# Patient Record
Sex: Female | Born: 1940
Health system: Southern US, Community
[De-identification: ages and names within clinical notes are randomized; demographics above are authoritative.]

## PROBLEM LIST (undated history)

## (undated) DIAGNOSIS — K921 Melena: Secondary | ICD-10-CM

## (undated) DIAGNOSIS — F429 Obsessive-compulsive disorder, unspecified: Secondary | ICD-10-CM

## (undated) DIAGNOSIS — G9332 Myalgic encephalomyelitis/chronic fatigue syndrome: Secondary | ICD-10-CM

## (undated) DIAGNOSIS — C4491 Basal cell carcinoma of skin, unspecified: Secondary | ICD-10-CM

## (undated) DIAGNOSIS — R5382 Chronic fatigue, unspecified: Secondary | ICD-10-CM

## (undated) DIAGNOSIS — M810 Age-related osteoporosis without current pathological fracture: Secondary | ICD-10-CM

## (undated) DIAGNOSIS — E785 Hyperlipidemia, unspecified: Secondary | ICD-10-CM

## (undated) DIAGNOSIS — T7840XA Allergy, unspecified, initial encounter: Secondary | ICD-10-CM

## (undated) DIAGNOSIS — F419 Anxiety disorder, unspecified: Secondary | ICD-10-CM

## (undated) HISTORY — DX: Melena: K92.1

## (undated) HISTORY — DX: Hyperlipidemia, unspecified: E78.5

## (undated) HISTORY — PX: BREAST ENHANCEMENT SURGERY: SHX7

## (undated) HISTORY — DX: Age-related osteoporosis without current pathological fracture: M81.0

## (undated) HISTORY — DX: Obsessive-compulsive disorder, unspecified: F42.9

## (undated) HISTORY — DX: Allergy, unspecified, initial encounter: T78.40XA

## (undated) HISTORY — DX: Anxiety disorder, unspecified: F41.9

## (undated) HISTORY — DX: Basal cell carcinoma of skin, unspecified: C44.91

## (undated) HISTORY — DX: Chronic fatigue, unspecified: R53.82

## (undated) HISTORY — DX: Myalgic encephalomyelitis/chronic fatigue syndrome: G93.32

---

## 1998-05-16 ENCOUNTER — Encounter: Admission: RE | Admit: 1998-05-16 | Discharge: 1998-05-16 | Payer: Self-pay | Admitting: Hematology and Oncology

## 1998-07-31 ENCOUNTER — Ambulatory Visit: Admission: RE | Admit: 1998-07-31 | Discharge: 1998-07-31 | Payer: Self-pay | Admitting: *Deleted

## 1998-10-07 ENCOUNTER — Ambulatory Visit (HOSPITAL_COMMUNITY): Admission: RE | Admit: 1998-10-07 | Discharge: 1998-10-07 | Payer: Self-pay | Admitting: Internal Medicine

## 1998-10-07 ENCOUNTER — Encounter: Admission: RE | Admit: 1998-10-07 | Discharge: 1998-10-07 | Payer: Self-pay | Admitting: Internal Medicine

## 1998-11-08 ENCOUNTER — Encounter: Admission: RE | Admit: 1998-11-08 | Discharge: 1998-11-08 | Payer: Self-pay | Admitting: Internal Medicine

## 1999-07-24 ENCOUNTER — Encounter: Admission: RE | Admit: 1999-07-24 | Discharge: 1999-07-24 | Payer: Self-pay | Admitting: Internal Medicine

## 1999-08-22 ENCOUNTER — Encounter: Admission: RE | Admit: 1999-08-22 | Discharge: 1999-08-22 | Payer: Self-pay | Admitting: Internal Medicine

## 1999-10-27 ENCOUNTER — Encounter: Admission: RE | Admit: 1999-10-27 | Discharge: 1999-10-27 | Payer: Self-pay | Admitting: Internal Medicine

## 1999-12-09 ENCOUNTER — Encounter: Admission: RE | Admit: 1999-12-09 | Discharge: 1999-12-09 | Payer: Self-pay | Admitting: Internal Medicine

## 1999-12-24 ENCOUNTER — Ambulatory Visit (HOSPITAL_COMMUNITY): Admission: RE | Admit: 1999-12-24 | Discharge: 1999-12-24 | Payer: Self-pay | Admitting: *Deleted

## 2000-10-12 ENCOUNTER — Encounter: Admission: RE | Admit: 2000-10-12 | Discharge: 2000-10-12 | Payer: Self-pay | Admitting: Hematology and Oncology

## 2001-01-11 ENCOUNTER — Encounter: Admission: RE | Admit: 2001-01-11 | Discharge: 2001-01-11 | Payer: Self-pay | Admitting: Internal Medicine

## 2001-01-20 ENCOUNTER — Encounter: Admission: RE | Admit: 2001-01-20 | Discharge: 2001-01-20 | Payer: Self-pay | Admitting: Internal Medicine

## 2001-01-24 ENCOUNTER — Encounter: Admission: RE | Admit: 2001-01-24 | Discharge: 2001-01-24 | Payer: Self-pay | Admitting: *Deleted

## 2001-01-24 ENCOUNTER — Encounter: Payer: Self-pay | Admitting: *Deleted

## 2001-02-03 ENCOUNTER — Encounter (INDEPENDENT_AMBULATORY_CARE_PROVIDER_SITE_OTHER): Payer: Self-pay | Admitting: *Deleted

## 2001-02-03 ENCOUNTER — Ambulatory Visit (HOSPITAL_BASED_OUTPATIENT_CLINIC_OR_DEPARTMENT_OTHER): Admission: RE | Admit: 2001-02-03 | Discharge: 2001-02-03 | Payer: Self-pay | Admitting: General Surgery

## 2001-02-24 ENCOUNTER — Encounter: Admission: RE | Admit: 2001-02-24 | Discharge: 2001-02-24 | Payer: Self-pay | Admitting: Internal Medicine

## 2001-08-03 ENCOUNTER — Encounter: Admission: RE | Admit: 2001-08-03 | Discharge: 2001-08-03 | Payer: Self-pay | Admitting: Internal Medicine

## 2001-08-25 ENCOUNTER — Encounter: Admission: RE | Admit: 2001-08-25 | Discharge: 2001-08-25 | Payer: Self-pay | Admitting: Internal Medicine

## 2001-10-27 ENCOUNTER — Encounter: Admission: RE | Admit: 2001-10-27 | Discharge: 2001-10-27 | Payer: Self-pay

## 2002-07-31 ENCOUNTER — Encounter: Admission: RE | Admit: 2002-07-31 | Discharge: 2002-07-31 | Payer: Self-pay | Admitting: Internal Medicine

## 2002-08-03 ENCOUNTER — Encounter: Payer: Self-pay | Admitting: *Deleted

## 2002-08-03 ENCOUNTER — Ambulatory Visit (HOSPITAL_COMMUNITY): Admission: RE | Admit: 2002-08-03 | Discharge: 2002-08-03 | Payer: Self-pay | Admitting: *Deleted

## 2002-08-10 ENCOUNTER — Encounter: Admission: RE | Admit: 2002-08-10 | Discharge: 2002-08-10 | Payer: Self-pay | Admitting: Internal Medicine

## 2002-08-24 ENCOUNTER — Encounter: Admission: RE | Admit: 2002-08-24 | Discharge: 2002-08-24 | Payer: Self-pay | Admitting: Internal Medicine

## 2002-08-31 ENCOUNTER — Encounter: Admission: RE | Admit: 2002-08-31 | Discharge: 2002-08-31 | Payer: Self-pay | Admitting: Internal Medicine

## 2002-09-18 ENCOUNTER — Encounter: Admission: RE | Admit: 2002-09-18 | Discharge: 2002-09-18 | Payer: Self-pay | Admitting: Internal Medicine

## 2002-10-05 ENCOUNTER — Encounter: Admission: RE | Admit: 2002-10-05 | Discharge: 2002-10-05 | Payer: Self-pay | Admitting: Internal Medicine

## 2002-11-16 ENCOUNTER — Encounter: Admission: RE | Admit: 2002-11-16 | Discharge: 2002-11-16 | Payer: Self-pay | Admitting: Internal Medicine

## 2002-12-26 ENCOUNTER — Encounter: Admission: RE | Admit: 2002-12-26 | Discharge: 2002-12-26 | Payer: Self-pay | Admitting: Internal Medicine

## 2003-01-12 ENCOUNTER — Encounter: Admission: RE | Admit: 2003-01-12 | Discharge: 2003-01-12 | Payer: Self-pay | Admitting: Internal Medicine

## 2003-02-01 ENCOUNTER — Encounter: Admission: RE | Admit: 2003-02-01 | Discharge: 2003-02-01 | Payer: Self-pay | Admitting: Internal Medicine

## 2003-10-08 ENCOUNTER — Encounter: Admission: RE | Admit: 2003-10-08 | Discharge: 2003-10-08 | Payer: Self-pay | Admitting: Internal Medicine

## 2004-06-13 ENCOUNTER — Encounter: Admission: RE | Admit: 2004-06-13 | Discharge: 2004-06-13 | Payer: Self-pay | Admitting: Internal Medicine

## 2004-06-13 ENCOUNTER — Encounter (INDEPENDENT_AMBULATORY_CARE_PROVIDER_SITE_OTHER): Payer: Self-pay | Admitting: Specialist

## 2004-06-13 ENCOUNTER — Other Ambulatory Visit: Admission: RE | Admit: 2004-06-13 | Discharge: 2004-06-13 | Payer: Self-pay | Admitting: Internal Medicine

## 2004-08-13 ENCOUNTER — Encounter: Admission: RE | Admit: 2004-08-13 | Discharge: 2004-08-13 | Payer: Self-pay | Admitting: Internal Medicine

## 2004-11-11 ENCOUNTER — Ambulatory Visit: Payer: Self-pay | Admitting: Internal Medicine

## 2005-05-07 ENCOUNTER — Ambulatory Visit: Payer: Self-pay | Admitting: Internal Medicine

## 2005-05-29 ENCOUNTER — Ambulatory Visit: Payer: Self-pay | Admitting: Internal Medicine

## 2005-07-27 ENCOUNTER — Ambulatory Visit: Payer: Self-pay | Admitting: Internal Medicine

## 2005-08-18 ENCOUNTER — Ambulatory Visit (HOSPITAL_COMMUNITY): Admission: RE | Admit: 2005-08-18 | Discharge: 2005-08-18 | Payer: Self-pay | Admitting: Internal Medicine

## 2005-08-18 ENCOUNTER — Encounter: Payer: Self-pay | Admitting: Internal Medicine

## 2005-10-21 ENCOUNTER — Encounter: Payer: Self-pay | Admitting: Internal Medicine

## 2005-10-21 ENCOUNTER — Ambulatory Visit: Payer: Self-pay | Admitting: Internal Medicine

## 2005-11-11 ENCOUNTER — Ambulatory Visit: Payer: Self-pay | Admitting: Internal Medicine

## 2005-11-13 ENCOUNTER — Ambulatory Visit: Payer: Self-pay | Admitting: Internal Medicine

## 2006-02-10 ENCOUNTER — Ambulatory Visit: Payer: Self-pay | Admitting: Internal Medicine

## 2006-03-10 ENCOUNTER — Ambulatory Visit: Payer: Self-pay | Admitting: Internal Medicine

## 2006-07-01 ENCOUNTER — Other Ambulatory Visit: Admission: RE | Admit: 2006-07-01 | Discharge: 2006-07-01 | Payer: Self-pay | Admitting: Internal Medicine

## 2006-07-01 ENCOUNTER — Encounter: Payer: Self-pay | Admitting: Internal Medicine

## 2006-07-01 ENCOUNTER — Ambulatory Visit: Payer: Self-pay | Admitting: Internal Medicine

## 2006-08-20 ENCOUNTER — Ambulatory Visit (HOSPITAL_COMMUNITY): Admission: RE | Admit: 2006-08-20 | Discharge: 2006-08-20 | Payer: Self-pay | Admitting: Internal Medicine

## 2006-08-20 ENCOUNTER — Ambulatory Visit: Payer: Self-pay | Admitting: Internal Medicine

## 2006-10-11 ENCOUNTER — Encounter: Payer: Self-pay | Admitting: Internal Medicine

## 2006-10-18 DIAGNOSIS — M533 Sacrococcygeal disorders, not elsewhere classified: Secondary | ICD-10-CM | POA: Insufficient documentation

## 2006-10-18 DIAGNOSIS — F411 Generalized anxiety disorder: Secondary | ICD-10-CM | POA: Insufficient documentation

## 2006-10-18 DIAGNOSIS — R5382 Chronic fatigue, unspecified: Secondary | ICD-10-CM | POA: Insufficient documentation

## 2006-10-18 DIAGNOSIS — Z978 Presence of other specified devices: Secondary | ICD-10-CM | POA: Insufficient documentation

## 2006-10-18 DIAGNOSIS — J309 Allergic rhinitis, unspecified: Secondary | ICD-10-CM | POA: Insufficient documentation

## 2006-10-18 DIAGNOSIS — K921 Melena: Secondary | ICD-10-CM

## 2006-10-26 ENCOUNTER — Encounter: Payer: Self-pay | Admitting: Internal Medicine

## 2006-11-10 ENCOUNTER — Ambulatory Visit: Payer: Self-pay | Admitting: Hospitalist

## 2007-03-03 ENCOUNTER — Telehealth: Payer: Self-pay | Admitting: *Deleted

## 2007-03-09 ENCOUNTER — Encounter: Payer: Self-pay | Admitting: Internal Medicine

## 2007-04-01 ENCOUNTER — Telehealth: Payer: Self-pay | Admitting: *Deleted

## 2007-04-04 ENCOUNTER — Telehealth: Payer: Self-pay | Admitting: *Deleted

## 2007-04-06 ENCOUNTER — Ambulatory Visit: Payer: Self-pay | Admitting: *Deleted

## 2007-04-06 DIAGNOSIS — G43009 Migraine without aura, not intractable, without status migrainosus: Secondary | ICD-10-CM | POA: Insufficient documentation

## 2007-04-06 DIAGNOSIS — F429 Obsessive-compulsive disorder, unspecified: Secondary | ICD-10-CM

## 2007-07-12 ENCOUNTER — Encounter: Payer: Self-pay | Admitting: Internal Medicine

## 2007-07-12 ENCOUNTER — Ambulatory Visit: Payer: Self-pay | Admitting: Internal Medicine

## 2007-07-12 LAB — CONVERTED CEMR LAB
AST: 18 units/L (ref 0–37)
Basophils Relative: 1 % (ref 0–1)
Calcium: 9.1 mg/dL (ref 8.4–10.5)
Chloride: 110 meq/L (ref 96–112)
Creatinine, Ser: 0.9 mg/dL (ref 0.40–1.20)
Eosinophils Absolute: 0.2 10*3/uL (ref 0.0–0.7)
Eosinophils Relative: 3 % (ref 0–5)
Lymphs Abs: 2 10*3/uL (ref 0.7–3.3)
MCHC: 33.4 g/dL (ref 30.0–36.0)
MCV: 92.6 fL (ref 78.0–100.0)
Monocytes Relative: 7 % (ref 3–11)
Neutro Abs: 2.5 10*3/uL (ref 1.7–7.7)
Potassium: 4.2 meq/L (ref 3.5–5.3)
RBC: 4.3 M/uL (ref 3.87–5.11)
Sodium: 146 meq/L — ABNORMAL HIGH (ref 135–145)
Total Bilirubin: 0.3 mg/dL (ref 0.3–1.2)
WBC: 5 10*3/uL (ref 4.0–10.5)

## 2007-10-06 ENCOUNTER — Ambulatory Visit: Payer: Self-pay | Admitting: *Deleted

## 2007-11-22 ENCOUNTER — Telehealth: Payer: Self-pay | Admitting: *Deleted

## 2008-04-16 ENCOUNTER — Telehealth: Payer: Self-pay | Admitting: Internal Medicine

## 2008-04-23 ENCOUNTER — Telehealth (INDEPENDENT_AMBULATORY_CARE_PROVIDER_SITE_OTHER): Payer: Self-pay | Admitting: Pharmacy Technician

## 2008-04-23 ENCOUNTER — Ambulatory Visit: Payer: Self-pay | Admitting: Infectious Disease

## 2008-04-23 DIAGNOSIS — J069 Acute upper respiratory infection, unspecified: Secondary | ICD-10-CM | POA: Insufficient documentation

## 2008-05-15 ENCOUNTER — Telehealth: Payer: Self-pay | Admitting: Internal Medicine

## 2008-05-16 ENCOUNTER — Telehealth (INDEPENDENT_AMBULATORY_CARE_PROVIDER_SITE_OTHER): Payer: Self-pay | Admitting: Pharmacy Technician

## 2008-08-02 ENCOUNTER — Ambulatory Visit: Payer: Self-pay | Admitting: *Deleted

## 2008-08-02 ENCOUNTER — Ambulatory Visit (HOSPITAL_COMMUNITY): Admission: RE | Admit: 2008-08-02 | Discharge: 2008-08-02 | Payer: Self-pay | Admitting: *Deleted

## 2008-08-02 ENCOUNTER — Encounter: Payer: Self-pay | Admitting: Internal Medicine

## 2008-08-02 DIAGNOSIS — R079 Chest pain, unspecified: Secondary | ICD-10-CM

## 2008-08-16 ENCOUNTER — Ambulatory Visit: Payer: Self-pay | Admitting: *Deleted

## 2008-08-16 ENCOUNTER — Encounter: Payer: Self-pay | Admitting: Internal Medicine

## 2008-08-16 LAB — CONVERTED CEMR LAB
BUN: 21 mg/dL (ref 6–23)
Calcium: 9.6 mg/dL (ref 8.4–10.5)
Chloride: 106 meq/L (ref 96–112)
Creatinine, Ser: 0.71 mg/dL (ref 0.40–1.20)
Glucose, Bld: 90 mg/dL (ref 70–99)
TSH: 1.842 microintl units/mL (ref 0.350–4.50)
Total CHOL/HDL Ratio: 3.1
VLDL: 22 mg/dL (ref 0–40)

## 2008-08-27 ENCOUNTER — Encounter: Admission: RE | Admit: 2008-08-27 | Discharge: 2008-08-27 | Payer: Self-pay | Admitting: Internal Medicine

## 2008-10-09 ENCOUNTER — Ambulatory Visit: Payer: Self-pay | Admitting: Infectious Disease

## 2008-10-09 DIAGNOSIS — M79609 Pain in unspecified limb: Secondary | ICD-10-CM

## 2008-10-11 ENCOUNTER — Encounter: Payer: Self-pay | Admitting: Internal Medicine

## 2008-10-15 ENCOUNTER — Encounter: Payer: Self-pay | Admitting: Internal Medicine

## 2008-10-15 ENCOUNTER — Ambulatory Visit: Payer: Self-pay | Admitting: Internal Medicine

## 2008-10-15 DIAGNOSIS — N952 Postmenopausal atrophic vaginitis: Secondary | ICD-10-CM

## 2008-11-05 ENCOUNTER — Ambulatory Visit (HOSPITAL_COMMUNITY): Admission: RE | Admit: 2008-11-05 | Discharge: 2008-11-05 | Payer: Self-pay | Admitting: Internal Medicine

## 2009-03-14 ENCOUNTER — Encounter (INDEPENDENT_AMBULATORY_CARE_PROVIDER_SITE_OTHER): Payer: Self-pay | Admitting: Internal Medicine

## 2009-03-14 ENCOUNTER — Encounter: Payer: Self-pay | Admitting: Internal Medicine

## 2009-03-14 ENCOUNTER — Ambulatory Visit: Payer: Self-pay | Admitting: *Deleted

## 2009-03-14 DIAGNOSIS — M810 Age-related osteoporosis without current pathological fracture: Secondary | ICD-10-CM | POA: Insufficient documentation

## 2009-03-14 DIAGNOSIS — M549 Dorsalgia, unspecified: Secondary | ICD-10-CM | POA: Insufficient documentation

## 2009-03-15 LAB — CONVERTED CEMR LAB
Cholesterol: 187 mg/dL (ref 0–200)
HDL: 54 mg/dL (ref >39)
VLDL: 23 mg/dL (ref 0–40)

## 2009-09-03 ENCOUNTER — Encounter: Payer: Self-pay | Admitting: Internal Medicine

## 2009-10-02 ENCOUNTER — Ambulatory Visit: Payer: Self-pay | Admitting: Internal Medicine

## 2009-10-04 ENCOUNTER — Telehealth: Payer: Self-pay | Admitting: *Deleted

## 2010-04-28 ENCOUNTER — Ambulatory Visit: Payer: Self-pay | Admitting: Internal Medicine

## 2010-05-05 DIAGNOSIS — E559 Vitamin D deficiency, unspecified: Secondary | ICD-10-CM | POA: Insufficient documentation

## 2010-05-05 LAB — CONVERTED CEMR LAB
AST: 17 units/L (ref 0–37)
BUN: 16 mg/dL (ref 6–23)
Basophils Absolute: 0 10*3/uL (ref 0.0–0.1)
Basophils Relative: 1 % (ref 0–1)
CO2: 24 meq/L (ref 19–32)
Calcium: 9.1 mg/dL (ref 8.4–10.5)
Creatinine, Ser: 0.68 mg/dL (ref 0.40–1.20)
Eosinophils Relative: 3 % (ref 0–5)
Glucose, Bld: 97 mg/dL (ref 70–99)
HDL: 60 mg/dL (ref >39)
Lymphocytes Relative: 39 % (ref 12–46)
Lymphs Abs: 1.8 10*3/uL (ref 0.7–4.0)
MCV: 93.7 fL (ref >=78.0)
Neutrophils Relative %: 51 % (ref 43–77)
Potassium: 3.8 meq/L (ref 3.5–5.3)
RBC: 4.42 M/uL (ref 3.87–5.11)
Total CHOL/HDL Ratio: 3.2
Total Protein: 6.5 g/dL (ref 6.0–8.3)
VLDL: 23 mg/dL (ref 0–40)
WBC: 4.7 10*3/uL (ref 4.0–10.5)

## 2010-05-06 ENCOUNTER — Telehealth: Payer: Self-pay | Admitting: Internal Medicine

## 2010-06-12 ENCOUNTER — Encounter: Admission: RE | Admit: 2010-06-12 | Discharge: 2010-06-12 | Payer: Self-pay | Admitting: Internal Medicine

## 2010-09-15 ENCOUNTER — Telehealth: Payer: Self-pay | Admitting: Internal Medicine

## 2010-09-16 ENCOUNTER — Ambulatory Visit: Payer: Self-pay | Admitting: Internal Medicine

## 2010-09-30 ENCOUNTER — Ambulatory Visit: Payer: Self-pay | Admitting: Internal Medicine

## 2011-01-18 ENCOUNTER — Encounter: Payer: Self-pay | Admitting: Internal Medicine

## 2011-01-29 NOTE — Progress Notes (Signed)
Summary: Vit D  Phone Note Outgoing Call   Call placed by: Angelina Ok RN,  May 06, 2010 10:56 AM Call placed to: Patient Summary of Call: Call to pt to inform her that her Vitamin D level is low per Dr. Phillips Odor.   Pt will need to take 2000 units of Vit D daily.  Pt said that she is alreaddy taking Vit D 800 mg and Calcium 1200 mg daily and wanted to double dosing of this instead.   Dr. Phillips Odor was consulted and pt is to take an additional 2000 mg of Vit D daily and to come in for an appointment in 3 months.  Prescription for has been called to pt's pharmacy.  Message was left on pt's answering machine to continue her current dosing and to take the additional 2000 mg tablets that has been ordered at her pharmacy. Angelina Ok RN  May 06, 2010 11:01 AM  Initial call taken by: Angelina Ok RN,  May 06, 2010 11:02 AM  Follow-up for Phone Call        agree with plan outline Follow-up by: Julaine Fusi  DO,  May 06, 2010 11:55 AM

## 2011-01-29 NOTE — Progress Notes (Signed)
Summary: Feeling weak  Phone Note Call from Patient   Caller: Patient Call For: Julaine Fusi  DO Summary of Call: Call from pt said that she had a bug a couple weeks ago.  Very weak.  Fire department did vitals every thing was within normal visits.  Said that she  in the past took the Mineral Vitamins and that helped.  Pt is unable to do a lot of activity. [facility].  No shortness of breath .  Has had this in the past. Angelina Ok RN  September 15, 2010 9:00 AM  Initial call taken by: Angelina Ok RN,  September 15, 2010 9:00 AM  Follow-up for Phone Call        She probably needs to be seen in clinic for more information and maybe some lab work especially if her weakness has been a couple of weeks- should be getting betetr if it was a virus etc... is she short of breath, and fevers.Marland Kitchenanything else going on? Can be seen by anyone just for a check. Follow-up by: Julaine Fusi  DO,  September 15, 2010 10:38 AM  Additional Follow-up for Phone Call Additional follow up Details #1::        Spoke with pt said that she is having no shortness of breath.  Just feeling realy tired.  Says she is sure she has Anemia.  Pt was given an appointment for tomorrow. Angelina Ok RN  September 15, 2010 5:25 PM

## 2011-01-29 NOTE — Assessment & Plan Note (Signed)
Summary: flu shot/cfb  Nurse Visit   Allergies: 1)  ! * No Blood Products 2)  ! * Environmental 3)  ! * "multi Chemical Sensitivity"  Immunizations Administered:  Influenza Vaccine # 1:    Vaccine Type: Fluvax MCR    Site: right deltoid    Mfr: [employer]    Dose: 0.5 ml    Route: IM    Given by: Chinita Pester RN    Exp. Date: 06/27/2011    Lot #: [id number]    VIS given: 07/22/10 version given September 30, 2010.  Flu Vaccine Consent Questions:    Do you have a history of severe allergic reactions to this vaccine? no    Any prior history of allergic reactions to egg and/or gelatin? no    Do you have a sensitivity to the preservative Thimersol? no    Do you have a past history of Guillan-Barre Syndrome? no    Do you currently have an acute febrile illness? no    Have you ever had a severe reaction to latex? no    Vaccine information given and explained to patient? yes    Are you currently pregnant? no  Orders Added: 1)  Influenza Vaccine MCR [00025]

## 2011-01-29 NOTE — Assessment & Plan Note (Signed)
Summary: ACUTE/GOLDING/FATIGUE/CH   Vital Signs:  Patient profile:   70 year old female Height:      65 inches (165.10 cm) Weight:      128.02 pounds (58.19 kg) BMI:     21.38 Temp:     98.2 degrees F (36.78 degrees C) oral Pulse rate:   75 / minute BP sitting:   120 / 79  (right arm)  Vitals Entered By: Angelina Ok RN (September 16, 2010 1:49 PM) Is Patient Diabetic? No Pain Assessment Patient in pain? no      Nutritional Status BMI of 19 -24 = normal  Have you ever been in a relationship where you felt threatened, hurt or afraid?No   Does patient need assistance? Functional Status Self care Ambulation Normal Comments Feels it is her Chronic Fatigue   Primary Care Provider:  Julaine Fusi  DO   History of Present Illness: 70 y/o woman with chornic fatigue OCDs, anxiety, vit D def comes for a routine follow up visit. says that she has been very weak in the recent past after a sore throat. she feels that this is a part of her chronic fatigue and seems to getting better. she request refills for multivitamins and miralax. also complaints of some discomfort in throat when swallowing,   Depression History:      The patient denies a depressed mood most of the day and a diminished interest in her usual daily activities.         Preventive Screening-Counseling & Management  Alcohol-Tobacco     Smoking Status: never  Current Medications (verified): 1)  Fluvoxamine Maleate 50 Mg Tabs (Fluvoxamine Maleate) .... Take 1 Tablet By Mouth Three Times A Day 2)  Imitrex 100 Mg Tabs (Sumatriptan Succinate) .... Take One Tab As Needed For Migraine Not To Exceed More That Two Tablets in 24 Hour Period. 3)  Oscal 500/200 D-3 500-200 Mg-Unit  Tabs (Calcium-Vitamin D) .Marland Kitchen.. 1 Tablet Twice A Day. 4)  Ferrous Sulfate 325 (65 Fe) Mg Tbec (Ferrous Sulfate) .... Take 1 Tablet By Mouth Once A Day 5)  Miralax  Powd (Polyethylene Glycol 3350) .... Mix It Water or Milk and Take Once Daily At Bedtime  As Needed For Constipations 6)  D 2000 2000 Unit Tabs (Cholecalciferol) .... Take 1 Tablet By Mouth Once A Day 7)  Pantoprazole Sodium 40 Mg Tbec (Pantoprazole Sodium) .... Take 1 Tablet By Mouth Once A Day 8)  Senior Multivitamin Plus  Tabs (Multiple Vitamins-Minerals) .... Take 1 Tablet By Mouth Two Times A Day. Take Folic Acid and Iron Supplements Extra Along With These Tablets. .  Allergies (verified): 1)  ! * No Blood Products 2)  ! * Environmental 3)  ! * "multi Chemical Sensitivity"  Review of Systems  The patient denies anorexia, fever, weight loss, weight gain, vision loss, decreased hearing, hoarseness, chest pain, syncope, dyspnea on exertion, peripheral edema, prolonged cough, headaches, hemoptysis, abdominal pain, melena, hematochezia, severe indigestion/heartburn, hematuria, incontinence, genital sores, muscle weakness, suspicious skin lesions, transient blindness, difficulty walking, depression, unusual weight change, abnormal bleeding, enlarged lymph nodes, angioedema, breast masses, and testicular masses.    Physical Exam  General:  alert and well-developed.   Head:  normocephalic and atraumatic.   Eyes:  vision grossly intact, pupils equal, pupils round, and pupils reactive to light.   Mouth:  pharynx pink and moist.   Lungs:  normal respiratory effort, no crackles, and no wheezes.   Heart:  normal rate, regular rhythm, no murmur, and no  gallop.   Abdomen:  soft, non-tender, normal bowel sounds, and no distention.   Pulses:  dorsalis pedis pulses normal bilaterally  Extremities:  no edema Neurologic:  OrientedX3, cranial nerver 2-12 intact,strength good in all extremities, sensations normal to light touch, reflexes 2+ b/l, gait normal    Impression & Recommendations:  Problem # 1:  CHRONIC FATIGUE SYNDROME (ICD-780.71) Its unclear what her subjective weakness is from. she had a blood work in 5/11 ( CBC, BMET, TSH, Lipid) which all looked normal . she feels she is  getting better. she deneis any blood in stool , wt loss, pain or other s/s mandating workup for malignancy. she had a normal colonscopy 2 yrs ago. she has a good diet and drinks 6-8 glasses of water daily. so she is not malnourished or dehysdrated. she does not sound depressed. she says she felt much healthy when she was on multivitamins but she has not been taking it for a while. will refill her multivitamins and ask her to return for a thorough blood work and evaluation if the weakenss conitnues to worsen or she develops other s/s.  Complete Medication List: 1)  Fluvoxamine Maleate 50 Mg Tabs (Fluvoxamine maleate) .... Take 1 tablet by mouth three times a day 2)  Imitrex 100 Mg Tabs (Sumatriptan succinate) .... Take one tab as needed for migraine not to exceed more that two tablets in 24 hour period. 3)  Oscal 500/200 D-3 500-200 Mg-unit Tabs (Calcium-vitamin d) .Marland Kitchen.. 1 tablet twice a day. 4)  Ferrous Sulfate 325 (65 Fe) Mg Tbec (Ferrous sulfate) .... Take 1 tablet by mouth once a day 5)  Miralax Powd (Polyethylene glycol 3350) .... Mix it water or milk and take once daily at bedtime as needed for constipations 6)  D 2000 2000 Unit Tabs (Cholecalciferol) .... Take 1 tablet by mouth once a day 7)  Pantoprazole Sodium 40 Mg Tbec (Pantoprazole sodium) .... Take 1 tablet by mouth once a day 8)  Senior Multivitamin Plus Tabs (Multiple vitamins-minerals) .... Take 1 tablet by mouth two times a day. take folic acid and iron supplements extra along with these tablets. .  Patient Instructions: 1)  Continue to take your medicines regularly and call the clinic in case you have concerns.  Prescriptions: SENIOR MULTIVITAMIN PLUS  TABS (MULTIPLE VITAMINS-MINERALS) Take 1 tablet by mouth two times a day. Take folic acid and iron supplements extra along with these tablets. .  #31 x 3   Entered and Authorized by:   Bethel Born MD   Signed by:   Bethel Born MD on 09/16/2010   Method used:   Electronically to         [facility]  [address] [id number]* (retail)       [address]       [place], [place]  [address]       Ph: [phone] or [phone]       Fax: [phone]   RxID:   [id number] PANTOPRAZOLE SODIUM 40 MG TBEC (PANTOPRAZOLE SODIUM) Take 1 tablet by mouth once a day  #31 x 3   Entered and Authorized by:   Bethel Born MD   Signed by:   Bethel Born MD on 09/16/2010   Method used:   Electronically to        [facility]  [address] [id number]* (retail)       [address]       [facility]  [place], [place]  [address]       Ph: [phone] or [phone]       Fax: [phone]   RxID:   [id number] MIRALAX  POWD (POLYETHYLENE GLYCOL 3350) mix it water or milk and take once daily at bedtime as needed for constipations  #1 x 6   Entered and Authorized by:   Bethel Born MD   Signed by:   Bethel Born MD on 09/16/2010   Method used:   Electronically to        [facility]  [address] [id number]* (retail)       [address]       [place], [place]  [address]       Ph: [phone] or [phone]       Fax: [phone]   RxID:   [id number]   Prevention & Chronic Care Immunizations   Influenza vaccine: Fluvax MCR  (10/02/2009)   Influenza vaccine deferral: Refused  (09/16/2010)   Influenza vaccine due: 08/28/2010    Tetanus booster: Not documented   Td booster deferral: Deferred  (10/02/2009)    Pneumococcal vaccine: Not documented   Pneumococcal vaccine deferral: Deferred  (09/16/2010)    H. zoster vaccine: Not documented   H. zoster vaccine deferral: Deferred  (09/16/2010)  Colorectal Screening   Hemoccult: Not documented    Colonoscopy: Not documented  Other Screening   Pap smear: NEGATIVE FOR INTRAEPITHELIAL LESIONS OR MALIGNANCY.  (10/15/2008)    Mammogram: ASSESSMENT: Negative - BI-RADS 1^MM DIGITAL SCREENING W/ IMPLANTS  (06/12/2010)   Mammogram action/deferral: Ordered  (10/02/2009)    DXA bone density  scan: Not documented   DXA bone density action/deferral: Deferred  (10/02/2009)   Smoking status: never  (09/16/2010)  Lipids   Total Cholesterol: 193  (04/28/2010)   Lipid panel action/deferral: Lipid Panel ordered   LDL: 110  (04/28/2010)   LDL Direct: Not documented   HDL: 60  (04/28/2010)   Triglycerides: 114  (04/28/2010)    Vital Signs:  Patient profile:   70 year old female Height:      65 inches (165.10 cm) Weight:      128.02 pounds (58.19 kg) BMI:     21.38 Temp:     98.2 degrees F (36.78 degrees C) oral Pulse rate:   75 / minute BP sitting:   120 / 79  (right arm)  Vitals Entered By: Angelina Ok RN (September 16, 2010 1:49 PM)

## 2011-01-29 NOTE — Assessment & Plan Note (Signed)
Summary: CHECKUP/SB.   Vital Signs:  Patient profile:   70 year old female Height:      65 inches (165.10 cm) Weight:      130.06 pounds (59.12 kg) BMI:     21.72 Temp:     98.2 degrees F (36.78 degrees C) oral Pulse rate:   69 / minute BP sitting:   116 / 73  (right arm)  Vitals Entered By: Angelina Ok RN (Apr 28, 2010 10:53 AM) Is Patient Diabetic? No Pain Assessment Patient in pain? no      Nutritional Status BMI of 19 -24 = normal  Have you ever been in a relationship where you felt threatened, hurt or afraid?No   Does patient need assistance? Functional Status Self care Ambulation Normal Comments Wants more on the refills on her headache meds.  Skin problems-scabs that have turned color.  Problems with sde  Small amout of pain at times.   Primary Care Provider:  Julaine Fusi  DO   History of Present Illness: Ms. Tiner comes in today for routine follow-up and medication refills. She has no complaints today   Depression History:      The patient denies a depressed mood most of the day and a diminished interest in her usual daily activities.         Preventive Screening-Counseling & Management  Alcohol-Tobacco     Smoking Status: never  Current Medications (verified): 1)  Fluvoxamine Maleate 50 Mg Tabs (Fluvoxamine Maleate) .... Take 1 Tablet By Mouth Three Times A Day 2)  Imitrex 100 Mg Tabs (Sumatriptan Succinate) .... Take One Tab As Needed For Migraine Not To Exceed More That Two Tablets in 24 Hour Period. 3)  Oscal 500/200 D-3 500-200 Mg-Unit  Tabs (Calcium-Vitamin D) .Marland Kitchen.. 1 Tablet Twice A Day. 4)  Ferrous Sulfate 325 (65 Fe) Mg Tbec (Ferrous Sulfate) .... Take 1 Tablet By Mouth Once A Day 5)  Miralax  Powd (Polyethylene Glycol 3350) .... Mix It Water or Milk and Take Once Daily At Bedtime As Needed For Constipations  Allergies (verified): 1)  ! * No Blood Products 2)  ! * Environmental 3)  ! * "multi Chemical Sensitivity"  Review of Systems    The patient complains of headaches.  The patient denies anorexia, fever, weight loss, weight gain, vision loss, chest pain, syncope, dyspnea on exertion, peripheral edema, prolonged cough, abdominal pain, muscle weakness, difficulty walking, and depression.    Physical Exam  General:  alert and well-developed.   Lungs:  normal respiratory effort, no crackles, and no wheezes.   Heart:  normal rate, regular rhythm, no murmur, and no gallop.   Abdomen:  soft, non-tender, normal bowel sounds, and no distention.   Msk:  normal ROM, no joint tenderness, and no joint swelling.   Extremities:  No clubbing, cyanosis, edema, or deformity noted with normal full range of motion of all joints.   Neurologic:  alert & oriented X3, strength normal in all extremities, and gait normal.   Skin:  color normal and no rashes.   Psych:  Oriented X3, normally interactive, and good eye contact.     Impression & Recommendations:  Problem # 1:  MIGRAINE, OPHTHALMIC (ICD-346.80) Recurrent migraine HA. Wide variabilty and frwequency. No problems with self management. Will continue to prescribe imitrex which she reports as being extremely helpful.  Her updated medication list for this problem includes:    Imitrex 100 Mg Tabs (Sumatriptan succinate) .Marland Kitchen... Take one tab as needed for migraine  not to exceed more that two tablets in 24 hour period.  Orders: T-Comprehensive Metabolic Panel [id number]) T-CBC w/Diff ([id number]) T-TSH ([id number])  Problem # 2:  OBSESSIVE-COMPULSIVE DISORDER (ICD-300.3) Will refill her fluvoxamine today.  Problem # 3:  ANXIETY (ICD-300.00) Stable no change. Her updated medication list for this problem includes:    Fluvoxamine Maleate 50 Mg Tabs (Fluvoxamine maleate) .Marland Kitchen... Take 1 tablet by mouth three times a day  Problem # 4:  OSTEOPOROSIS, GENERALIZED (ICD-733.00) Take OTC NSAIDS occasionally for pain.  Problem # 5:  Preventive Health Care (ICD-V70.0) Bilateral breast  implants- will need MRI screening for breast Ca.  Complete Medication List: 1)  Fluvoxamine Maleate 50 Mg Tabs (Fluvoxamine maleate) .... Take 1 tablet by mouth three times a day 2)  Imitrex 100 Mg Tabs (Sumatriptan succinate) .... Take one tab as needed for migraine not to exceed more that two tablets in 24 hour period. 3)  Oscal 500/200 D-3 500-200 Mg-unit Tabs (Calcium-vitamin d) .Marland Kitchen.. 1 tablet twice a day. 4)  Ferrous Sulfate 325 (65 Fe) Mg Tbec (Ferrous sulfate) .... Take 1 tablet by mouth once a day 5)  Miralax Powd (Polyethylene glycol 3350) .... Mix it water or milk and take once daily at bedtime as needed for constipations  Other Orders: T-Lipid Profile [id number]) T-Vitamin D (25-Hydroxy) [id number]) MRI without Contrast (MRI w/o Contrast) Mammogram (Mammogram)  Patient Instructions: 1)  Please schedule a follow-up appointment in 6 months. Prescriptions: OSCAL 500/200 D-3 500-200 MG-UNIT  TABS (CALCIUM-VITAMIN D) 1 tablet twice a day.  #60 x 11   Entered and Authorized by:   Julaine Fusi  DO   Signed by:   Julaine Fusi  DO on 04/28/2010   Method used:   Electronically to        [id number] (retail)       [address]       [place], [place]  [address]       Ph: [phone] or [phone]       Fax: [phone]   RxID:   [id number] FLUVOXAMINE MALEATE 50 MG TABS (FLUVOXAMINE MALEATE) Take 1 tablet by mouth three times a day  #90 x 11   Entered and Authorized by:   Julaine Fusi  DO   Signed by:   Julaine Fusi  DO on 04/28/2010   Method used:   Electronically to        [id number]* (retail)       [address]       [place], [place]  [address]       Ph: [phone] or [phone]       Fax: [phone]   RxID:   [id number] IMITREX 100 MG TABS (SUMATRIPTAN SUCCINATE) Take one tab as needed for migraine not to exceed more that two tablets in 24 hour period.  #10 x 11   Entered and  Authorized by:   Julaine Fusi  DO   Signed by:   Julaine Fusi  DO on 04/28/2010   Method used:   Electronically to        [id number]* (retail)       [address]       [place], [place]  [address]       Ph: [phone] or [phone]  Fax: [phone]   RxID:   [id number]   Prevention & Chronic Care Immunizations   Influenza vaccine: Fluvax MCR  (10/02/2009)   Influenza vaccine due: 08/28/2010    Tetanus booster: Not documented   Td booster deferral: Deferred  (10/02/2009)    Pneumococcal vaccine: Not documented    H. zoster vaccine: Not documented  Colorectal Screening   Hemoccult: Not documented    Colonoscopy: Not documented  Other Screening   Pap smear: NEGATIVE FOR INTRAEPITHELIAL LESIONS OR MALIGNANCY.  (10/15/2008)    Mammogram: No specific mammographic evidence of malignancy.  Assessment: BIRADS 1.   (08/31/2008)   Mammogram action/deferral: Ordered  (10/02/2009)    DXA bone density scan: Not documented   DXA bone density action/deferral: Deferred  (10/02/2009)   Smoking status: never  (04/28/2010)  Lipids   Total Cholesterol: 187  (03/14/2009)   Lipid panel action/deferral: Lipid Panel ordered   LDL: 110  (03/14/2009)   LDL Direct: Not documented   HDL: 54  (03/14/2009)   Triglycerides: 116  (03/14/2009)  Process Orders Check Orders Results:     [facility]: Check successful Tests Sent for requisitioning (May 05, 2010 10:36 PM):     04/28/2010: [facility] -- T-Lipid Profile [id number] (signed)     04/28/2010: [facility] -- T-Comprehensive Metabolic Panel [[id number]] (signed)     04/28/2010: [facility] -- T-CBC w/Diff [[id number]] (signed)     04/28/2010: [facility] -- T-TSH [phone] (signed)     04/28/2010: [facility] -- T-Vitamin D (25-Hydroxy) [id number] (signed)     Vital  Signs:  Patient profile:   70 year old female Height:      65 inches (165.10 cm) Weight:      130.06 pounds (59.12 kg) BMI:     21.72 Temp:     98.2 degrees F (36.78 degrees C) oral Pulse rate:   69 / minute BP sitting:   116 / 73  (right arm)  Vitals Entered By: Angelina Ok RN (Apr 28, 2010 10:53 AM)

## 2011-05-07 ENCOUNTER — Other Ambulatory Visit: Payer: Self-pay | Admitting: Internal Medicine

## 2011-05-15 NOTE — Op Note (Signed)
[place]  Patient:    Shelly Campbell, Shelly Campbell                    MRN: [id number] Proc. Date: 02/03/01 Adm. Date:  [id number] Attending:  Brandy Hale CC:         Myles Rosenthal, M.D.   Operative Report  PREOPERATIVE DIAGNOSIS:  Left breast mass.  POSTOPERATIVE DIAGNOSIS:  Left breast mass.  OPERATION PERFORMED:  Left breast biopsy.  SURGEON:  Angelia Mould. Derrell Lolling, M.D.  ANESTHESIA:  INDICATIONS FOR PROCEDURE:  The patient is a 70 year old white female who had bilateral breast implants placed by Dr. Dorthula Perfect in 1973.  She recently noted a small lump in the left breast inferiorly and has noticed this for about two months.  There was minimal discomfort and no nipple discharge and no skin change but she is quite anxious about this.  Mammograms and breast ultrasound were performed at the [facility] [place] on January 28. Mammograms do not show any abnormality but on ultrasound, a 4 mm nodule is seen superficially.  This was read as birads category 4, suspicious, biopsy recommended.  On exam, she has a 4 to 5 mm firm, somewhat mobile nodule at the areolar margin of the left breast at the 7 oclock position.  This does not appear to be fixed to the capsule of the implant but is close.  She is brougth to the operating room for biopsy of this mass.  DESCRIPTION OF PROCEDURE:  Following induction of general endotracheal anesthesia, the patients left breast was prepped and draped in sterile fashion.  I did not use any local anesthesia and did not use any needles to inject anything into the breast.  A curved incision was made at the areolar margin.  With careful sharp dissection and spreading dissection, we were able to dissect down into the breast tissue until we found a 5 mm firm hard nodule which was near the capsule but not attached to the capsule of the implant. This was excised and sent for routine histologic exam.  I did not enter  the capsule of the breast implant.  The wound was irrigated with saline. Hemostasis was achieved with electrocautery.  The skin was closed with a running subcuticular suture of 4-0 Vicryl and Steri-Strips.  Clean bandages were placed and the patient taken to the recovery room in stable condition. Estimated blood loss was about 10 cc.  Complications were none.  Sponge, needle and instrument counts were correct. DD:  02/03/01 TD:  02/04/01 Job: [id number]

## 2011-08-06 ENCOUNTER — Encounter: Payer: Self-pay | Admitting: Internal Medicine

## 2011-08-14 ENCOUNTER — Encounter: Payer: Self-pay | Admitting: Internal Medicine

## 2011-08-14 ENCOUNTER — Ambulatory Visit (INDEPENDENT_AMBULATORY_CARE_PROVIDER_SITE_OTHER): Payer: Medicare Other | Admitting: Internal Medicine

## 2011-08-14 DIAGNOSIS — J309 Allergic rhinitis, unspecified: Secondary | ICD-10-CM

## 2011-08-14 DIAGNOSIS — F429 Obsessive-compulsive disorder, unspecified: Secondary | ICD-10-CM

## 2011-08-14 DIAGNOSIS — G43809 Other migraine, not intractable, without status migrainosus: Secondary | ICD-10-CM

## 2011-08-14 DIAGNOSIS — R1310 Dysphagia, unspecified: Secondary | ICD-10-CM

## 2011-08-14 DIAGNOSIS — F411 Generalized anxiety disorder: Secondary | ICD-10-CM

## 2011-08-14 NOTE — Progress Notes (Signed)
Subjective:    Patient ID: Shelly Campbell, female    DOB: 1941-06-01, 70 y.o.   MRN: [id number]  HPI: This patient is a 70 year old female who comes in today for a checkup, [name] her new doctor Dr. Dorise Hiss. She does have significant medical history for anxiety, OCD, migraines, allergies. She states that last night she was feeling a little bit anxious in bed and did have some racing of her heart. She did not have any chest tightness or chest pain, she did not break out into a sweat, she did not have any numbness or tingling in her arms. She did take several deep breaths and this racing sensation did alleviate. She has not had any other episodes. She does not have any heart problems. She does not have any history of heart problems. 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 She does actively go out into the community to talk to people. She is a nonsmoker, and has never smoked. She does state that she gets a little bit of dysphagia when swallowing. It feels like solid food gets caught in her throat. She has no problem swallowing liquids. She states that this problem has been going on for many years and has been stable over these years. She would like to go see a GI doctor at this time for this problem.    Review of Systems  Constitutional: Negative for fever, chills, diaphoresis, activity change, appetite change, fatigue and unexpected weight change.  HENT: Positive for trouble swallowing. Negative for hearing loss, ear pain, nosebleeds, congestion, sore throat, facial swelling, rhinorrhea, sneezing, drooling, mouth sores, neck pain, neck stiffness, dental problem, voice change, postnasal drip, sinus pressure, tinnitus and ear discharge.   Eyes: Negative.   Respiratory: Negative for cough, choking, chest tightness, shortness of breath, wheezing and stridor.   Cardiovascular: Positive for palpitations. Negative for chest pain and leg swelling.       Patient did have one time  episode of heart racing. It did resolve spontaneously.  Gastrointestinal: Negative.  Negative for nausea, vomiting, abdominal pain, diarrhea, constipation and abdominal distention.  Genitourinary: Negative.   Musculoskeletal: Positive for arthralgias.       Patient does have arthritis in her back.  Skin: Negative.   Neurological: Negative.   Hematological: Negative.   Psychiatric/Behavioral: Negative.    Vitals: BP: 110/73 Temperature: 98.47F Weight: 121 pounds Pulse: 77    Objective:   Physical Exam  Constitutional: She is oriented to person, place, and time. She appears well-developed and well-nourished.  HENT:  Head: Normocephalic and atraumatic.  Eyes: EOM are normal. Pupils are equal, round, and reactive to light.  Neck: Normal range of motion. Neck supple. No JVD present. No tracheal deviation present. No thyromegaly present.  Cardiovascular: Normal rate, regular rhythm, normal heart sounds and intact distal pulses.  Exam reveals no gallop and no friction rub.   No murmur heard. Pulmonary/Chest: Effort normal and breath sounds normal. No stridor. No respiratory distress. She has no wheezes. She has no rales. She exhibits no tenderness.  Abdominal: Soft. Bowel sounds are normal. She exhibits no distension. There is no tenderness. There is no rebound and no guarding.  Musculoskeletal: Normal range of motion.  Lymphadenopathy:    She has no cervical adenopathy.  Neurological: She is alert and oriented to person, place, and time. No cranial nerve deficit.  Skin: Skin is warm and dry. No rash noted. No erythema.  Psychiatric: She has a normal mood and affect. Her  behavior is normal. Judgment and thought content normal.          Assessment & Plan:  1. Heart racing-I believe that this is stemming from anxiety. I do not believe this is cardiac related. I did tell the patient that if she has any return or worsening of these symptoms to please call our office and get seen  again.  2. Please see problem-oriented charting.  3. Dysphagia-I do believe this is probably related to come some kind of a stricture. We will send her to a GI doctor. They would be the ones that would be able to intervene.  4. Health maintenance-patient has been colonoscopy, Pneumovax, she gets flu shot yearly. She is possibly due for mammogram however states that since getting implants in her early years she is very uncomfortable during mammograms and would like to move them to every 2 years. She will thinks she may be due for a Pap smear, and told her that we will do that at our next visit in 6 months.   5. Disposition-patient will be seen back in 6 months. I have told her that she needs to be seen sooner has return of symptoms she is free to call our clinic and make an appointment sooner.

## 2011-08-14 NOTE — Assessment & Plan Note (Signed)
Controlled off medications 

## 2011-08-14 NOTE — Assessment & Plan Note (Signed)
Patient's anxiety is well-controlled on Luvox. No change in regimen at this time.

## 2011-08-14 NOTE — Patient Instructions (Signed)
You were seen today for a follow up visit and to meet your new doctor, Dr. Dorise Hiss. We are not making any changes to your medicines at this time. We are going to have you see a GI doctor for your problems swallowing. If you have any problems or need to be seen sooner please feel free to call our office. Our number is [phone]. We will see you back in 6 months.

## 2011-08-14 NOTE — Assessment & Plan Note (Signed)
Patient does get occasional migraine, she does use Imitrex for these migraines. She states that she very infrequently takes Imitrex.

## 2011-08-14 NOTE — Assessment & Plan Note (Signed)
Per patient well-controlled on Luvox. No change in regimen at this time.

## 2011-08-17 NOTE — Progress Notes (Signed)
I agree with assessment and plan as per Dr. Kollar. 

## 2011-09-01 ENCOUNTER — Encounter: Payer: Self-pay | Admitting: Gastroenterology

## 2011-09-16 ENCOUNTER — Encounter: Payer: Self-pay | Admitting: Gastroenterology

## 2011-09-22 ENCOUNTER — Ambulatory Visit: Payer: Medicare Other | Admitting: Gastroenterology

## 2011-09-30 ENCOUNTER — Ambulatory Visit: Payer: Medicare Other

## 2011-10-05 NOTE — Progress Notes (Signed)
Addended by: Neomia Dear on: 10/05/2011 02:24 PM   Modules accepted: Orders

## 2011-10-07 ENCOUNTER — Ambulatory Visit: Payer: Medicare Other

## 2011-10-09 ENCOUNTER — Encounter: Payer: Medicare Other | Admitting: Internal Medicine

## 2011-10-22 ENCOUNTER — Other Ambulatory Visit: Payer: Self-pay | Admitting: *Deleted

## 2011-10-26 MED ORDER — POLYETHYLENE GLYCOL 3350 17 G PO PACK: 17.0000 g | PACK | Freq: Every day | ORAL | Status: DC

## 2011-10-30 ENCOUNTER — Encounter: Payer: Self-pay | Admitting: Internal Medicine

## 2011-10-30 ENCOUNTER — Ambulatory Visit (INDEPENDENT_AMBULATORY_CARE_PROVIDER_SITE_OTHER): Payer: Medicare Other | Admitting: Internal Medicine

## 2011-10-30 ENCOUNTER — Encounter: Payer: Self-pay | Admitting: Gastroenterology

## 2011-10-30 DIAGNOSIS — G43809 Other migraine, not intractable, without status migrainosus: Secondary | ICD-10-CM

## 2011-10-30 DIAGNOSIS — F411 Generalized anxiety disorder: Secondary | ICD-10-CM

## 2011-10-30 DIAGNOSIS — R1310 Dysphagia, unspecified: Secondary | ICD-10-CM

## 2011-10-30 DIAGNOSIS — F429 Obsessive-compulsive disorder, unspecified: Secondary | ICD-10-CM

## 2011-10-30 NOTE — Assessment & Plan Note (Signed)
Patient is controlled on Luvox, and she does occasionally not take her medications. This does cause her OCD to get worse. She then resumes taking medication and it is alleviated. Her daughter is support for her and encourages her to take her medications.

## 2011-10-30 NOTE — Assessment & Plan Note (Signed)
Patient is controlled on Luvox. She does occasionally not take her medication. When she does resume taking medication this does alleviate her symptoms. No change to regimen at today's visit.

## 2011-10-30 NOTE — Assessment & Plan Note (Signed)
Patient does have Imitrex however does not take it. She states she has not had a bad headache in some time.

## 2011-10-30 NOTE — Patient Instructions (Signed)
You were seen today as a follow up and to get a visit with a GI doctor. We have given you the information about the appointment, please remember to go see them. We have not changed any of your medications today. We will see you back in February for a PAP smear. If you have any questions or problems before then please feel free to call our office. Our number is [phone].

## 2011-10-30 NOTE — Progress Notes (Signed)
  Subjective:    Patient ID: Shelly Campbell, female    DOB: [birth year], 70 y.o.   MRN: [id number]  HPI The patient is a 70 year old female who comes in today for a followup visit and to get a GI appointment. She has a past medical history of OCD, mild arthritis in her back, and anxiety. She's done okay with her OCD however she doesn't always take her medications as she does not like to take pills. She states that her daughter lives very close and actually did move closer to her and now only lives 3 miles away. She said that her daughter is a good resource for letting her know when she does need to take her medications and for 10 keeping track of her as well. 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 She is still having the dysphagia that she had back in August, however there is mix up with the GI appointment and she has not seen them yet. She still feels as though things are getting stuck in her throat when she swallows. More with solids.   Review of Systems  Constitutional: Negative for fever, activity change, appetite change, fatigue and unexpected weight change.  HENT: Positive for trouble swallowing. Negative for sore throat, facial swelling, drooling, mouth sores, neck pain, neck stiffness, dental problem and voice change.   Respiratory: Negative for cough, choking, chest tightness, shortness of breath, wheezing and stridor.   Cardiovascular: Negative for chest pain, palpitations and leg swelling.  Gastrointestinal: Positive for constipation. Negative for nausea, abdominal pain, diarrhea and abdominal distention.       Patient has chronic constipation, this is at her baseline.  Musculoskeletal: Positive for back pain. Negative for myalgias, joint swelling, arthralgias and gait problem.       Back pain occasional, relieved with slight rest.  Skin: Negative.   Neurological: Negative for tremors, seizures, syncope, speech  difficulty, weakness, light-headedness, numbness and headaches.  Psychiatric/Behavioral:       Patient does have OCD, however is controlled at this time. She does occasionally go without her medications and it does flare up.    Vitals: Blood pressure: 116/56 Pulse: 65 Temperature: 97.21F Respiration: 20 Weight 121 pounds    Objective:   Physical Exam  Constitutional: She is oriented to person, place, and time. She appears well-developed and well-nourished.  HENT:  Head: Normocephalic and atraumatic.  Eyes: EOM are normal. Pupils are equal, round, and reactive to light.  Neck: Normal range of motion. Neck supple.  Cardiovascular: Normal rate and regular rhythm.   Pulmonary/Chest: Effort normal and breath sounds normal.  Abdominal: Soft. Bowel sounds are normal. She exhibits no distension. There is no tenderness. There is no rebound.  Musculoskeletal: Normal range of motion.  Neurological: She is alert and oriented to person, place, and time.  Skin: Skin is warm and dry.  Psychiatric: She has a normal mood and affect. Her behavior is normal. Judgment and thought content normal.          Assessment & Plan:  1. Please see problem-oriented charting.  2. Disposition-patient will have GI appointment on November 27. No change to her medications at today's visit. We will see her back in February for her Pap smear.

## 2011-11-02 NOTE — Progress Notes (Signed)
I agree with assessment and plan of Dr. Kollar. 

## 2011-11-23 ENCOUNTER — Encounter: Payer: Self-pay | Admitting: *Deleted

## 2011-11-24 ENCOUNTER — Ambulatory Visit (INDEPENDENT_AMBULATORY_CARE_PROVIDER_SITE_OTHER): Payer: Medicare Other | Admitting: Gastroenterology

## 2011-11-24 ENCOUNTER — Encounter: Payer: Self-pay | Admitting: Gastroenterology

## 2011-11-24 DIAGNOSIS — Z531 Procedure and treatment not carried out because of patient's decision for reasons of belief and group pressure: Secondary | ICD-10-CM | POA: Insufficient documentation

## 2011-11-24 DIAGNOSIS — F429 Obsessive-compulsive disorder, unspecified: Secondary | ICD-10-CM

## 2011-11-24 DIAGNOSIS — IMO0001 Reserved for inherently not codable concepts without codable children: Secondary | ICD-10-CM

## 2011-11-24 DIAGNOSIS — R1310 Dysphagia, unspecified: Secondary | ICD-10-CM | POA: Insufficient documentation

## 2011-11-24 DIAGNOSIS — Z83719 Family history of colon polyps, unspecified: Secondary | ICD-10-CM

## 2011-11-24 DIAGNOSIS — Z8371 Family history of colonic polyps: Secondary | ICD-10-CM | POA: Insufficient documentation

## 2011-11-24 DIAGNOSIS — K219 Gastro-esophageal reflux disease without esophagitis: Secondary | ICD-10-CM

## 2011-11-24 MED ORDER — RABEPRAZOLE SODIUM 20 MG PO TBEC: 20.0000 mg | DELAYED_RELEASE_TABLET | Freq: Every day | ORAL | Status: DC

## 2011-11-24 NOTE — Progress Notes (Signed)
History of Present Illness:  This is a 70 year old Caucasian female Je[employer] Witness who presents with" lifelong" swallowing difficulties mostly for solid foods. She also has problem with pills, and has had some classical acid reflux symptoms. She has not had previous endoscopy or barium studies. She does suffer from chronic fatigue syndrome, fibromyalgia, has a history of IBS. She also has history of" multiple chemical sensitivities." I should as previously seen Dr. Bernette Redbird and had a negative colonoscopy 5 years ago. The patient does suffer from chronic functional constipation is all daily MiraLax and daily Luvox  with when necessary Imitrex use. She also regular diet has had no food sensitivities, anorexia or weight loss. She specifically denies a history of pancreatitis, hepatitis, gallbladder disease, melena or hematochezia. Review of her previous GI evaluation and colonoscopy from [facility] shows no evidence of significant colon polyps at the time of her colonoscopy. She did not have endoscopy at that time.  I have reviewed this patient's present history, medical and surgical past history, allergies and medications.     ROS: The remainder of the 10 point ROS is negative.. chronic fatigue, chronic back pain, recurrent headaches, fibromyalgia with osteoarthritis. She currently denies any cardiovascular or pulmonary complaint     Physical Exam: General well developed well nourished patient in no acute distress, appearing her stated age Eyes PERRLA, no icterus, fundoscopic exam per opthamologist Skin no lesions noted Neck supple, no adenopathy, no thyroid enlargement, no tenderness. Examination of the oropharynx is unremarkable. Chest clear to percussion and auscultation Heart no significant murmurs, gallops or rubs noted Abdomen no hepatosplenomegaly masses or tenderness, BS normal.  Extremities no acute joint lesions, edema, phlebitis or evidence of cellulitis. Neurologic patient oriented  x 3, cranial nerves intact, no focal neurologic deficits noted. Psychological mental status normal and normal affect.  Assessment and plan: Probable Schatzki's ring versus chronic GERD and a benign peptic stricture of the distal esophagus. I have scheduled her for endoscopy and dilatation at her convenience, and  started AcipHex 20 mg a day. If her endoscopy is unremarkable, we'll proceed with esophageal manometry/barium swallow to exclude achalasia or other neuromuscular swallowing disorders. This patient is on Luvox for OCD disorder. 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  No diagnosis found.

## 2011-11-24 NOTE — Patient Instructions (Signed)
Your procedure has been scheduled for 12/02/2011, please follow the seperate instructions.  Take Aciphex one tablet by mouth once a day, samples given and rx sent to your pharmacy.

## 2011-11-25 ENCOUNTER — Encounter: Payer: Medicare Other | Admitting: Gastroenterology

## 2011-11-26 ENCOUNTER — Telehealth: Payer: Self-pay | Admitting: *Deleted

## 2011-11-26 NOTE — Telephone Encounter (Signed)
Called pt to advise that Aciphex if not covered by her insurance and she would like to try aciphex samples and call back if they help. If she needs a PPI her insurance cover Dexilant and Nexium.

## 2011-12-02 ENCOUNTER — Encounter: Payer: Medicare Other | Admitting: Gastroenterology

## 2011-12-09 ENCOUNTER — Ambulatory Visit (AMBULATORY_SURGERY_CENTER): Payer: Medicare Other | Admitting: Gastroenterology

## 2011-12-09 ENCOUNTER — Encounter: Payer: Self-pay | Admitting: Gastroenterology

## 2011-12-09 VITALS — BP 142/72 | HR 70 | Temp 97.8°F | Resp 18 | Ht 65.0 in | Wt 121.0 lb

## 2011-12-09 DIAGNOSIS — K222 Esophageal obstruction: Secondary | ICD-10-CM

## 2011-12-09 DIAGNOSIS — Q391 Atresia of esophagus with tracheo-esophageal fistula: Secondary | ICD-10-CM

## 2011-12-09 DIAGNOSIS — R1310 Dysphagia, unspecified: Secondary | ICD-10-CM

## 2011-12-09 HISTORY — DX: Esophageal obstruction: K22.2

## 2011-12-09 MED ORDER — SODIUM CHLORIDE 0.9 % IV SOLN: 500.0000 mL | INTRAVENOUS | Status: DC

## 2011-12-09 NOTE — Progress Notes (Signed)
Propofol per s camp crna per protocol. See scanned intra procedure report. ewm

## 2011-12-09 NOTE — Op Note (Signed)
[facility] [address] [place], [place]  [address]  ENDOSCOPY PROCEDURE REPORT  PATIENT:  Shelly Campbell, Shelly Campbell  MR#:  [id number] BIRTHDATE:  1941/01/01, 70 yrs. old  GENDER:  female  ENDOSCOPIST:  Vania Rea. Jarold Motto, MD, [facility] Referred by:  Genella Mech, M.D.  PROCEDURE DATE:  12/09/2011 PROCEDURE:  EGD, diagnostic 43235, Maloney Dilation of Esophagus ASA CLASS:  Class III INDICATIONS:  dysphagia  MEDICATIONS:   propofol (Diprivan) 100 mg IV TOPICAL ANESTHETIC:  DESCRIPTION OF PROCEDURE:   After the risks and benefits of the procedure were explained, informed consent was obtained.  The LB GIF-H180 [id number] endoscope was introduced through the mouth and advanced to the second portion of the duodenum.  The instrument was slowly withdrawn as the mucosa was fully examined. <<PROCEDUREIMAGES>>  The upper, middle, and distal third of the esophagus were carefully inspected and no abnormalities were noted. The z-line was well seen at the GEJ. The endoscope was pushed into the fundus which was normal including a retroflexed view. The antrum,gastric body, first and second part of the duodenum were unremarkable. DILATED #55F MALONEY DILATOR.NO HEME OR PAIN.    Retroflexed views revealed no abnormalities.    The scope was then withdrawn from the patient and the procedure completed.  COMPLICATIONS:  None  ENDOSCOPIC IMPRESSION: 1) Normal EGD ??? OCCULT SCHATZSKI'S RING DILATED. RECOMMENDATIONS: MANOMETRY IF DYSPHAGIA PERSISTS.  ______________________________ Vania Rea. Jarold Motto, MD, [facility]  CC:  n. eSIGNED:   Vania Rea. Patterson at 12/09/2011 11:14 AM  Haskel Khan, [phone]

## 2011-12-09 NOTE — Progress Notes (Signed)
PATIENT RETRACTED HIPPA STATUS AT TIME OF DISCHARGE REQUESTED MD DISCUSS FINDINGS WITH HER DAUGHTER. DR. Jarold Motto IN AND DISCUSSED WITH DAUGHTER, SEALED ENVELOPE WITH ALL REPORTS GIVEN TO PATIENT. DISCHARGE INSTRUCTIONS TO DAUGHTER VERBALLY.

## 2011-12-09 NOTE — Patient Instructions (Signed)
FOLLOW THE INSTRUCTIONS ON THE GREEN AND BLUE INSTRUCTION SHEETS.  FOLLOW THE DILATATION DIET: NOTHING TO EAT OR DRINK UNTIL 1215. 1215 TO 115 ONLY CLEAR LIQUIDS. AFTER 115 SOFT FOODS FOR THE REMAINDER OF THE DAY UNTIL TOMORROW AM.

## 2011-12-09 NOTE — Progress Notes (Signed)
Patient did not experience any of the following events: a burn prior to discharge; a fall within the facility; wrong site/side/patient/procedure/implant event; or a hospital transfer or hospital admission upon discharge from the facility. (G8907) Patient did not have preoperative order for IV antibiotic SSI prophylaxis. (G8918)  

## 2011-12-10 ENCOUNTER — Telehealth: Payer: Self-pay | Admitting: *Deleted

## 2011-12-10 NOTE — Telephone Encounter (Signed)

## 2012-04-25 ENCOUNTER — Encounter: Payer: Self-pay | Admitting: Internal Medicine

## 2012-04-25 ENCOUNTER — Ambulatory Visit (INDEPENDENT_AMBULATORY_CARE_PROVIDER_SITE_OTHER): Payer: Medicare Other | Admitting: Internal Medicine

## 2012-04-25 VITALS — BP 102/68 | HR 71 | Wt 122.5 lb

## 2012-04-25 DIAGNOSIS — J329 Chronic sinusitis, unspecified: Secondary | ICD-10-CM | POA: Diagnosis not present

## 2012-04-25 DIAGNOSIS — J310 Chronic rhinitis: Secondary | ICD-10-CM

## 2012-04-25 MED ORDER — FLUTICASONE PROPIONATE 50 MCG/ACT NA SUSP: 1.0000 | Freq: Every day | NASAL | Status: DC

## 2012-04-25 MED ORDER — AZITHROMYCIN 250 MG PO TABS: ORAL_TABLET | ORAL | Status: DC

## 2012-04-25 NOTE — Patient Instructions (Signed)
Please, pick up your prescriptions at your pharmacy and call with any questions. Please, follow up with Dr. Dorise Hiss if you are not feeling any better.

## 2012-04-25 NOTE — Progress Notes (Signed)
Patient ID: Shelly Campbell, female   DOB: [birth year], 71 y.o.   MRN: [id number] HPI:    1. C/o right facial congestion and pain  With postnasal drip and coughing up "green gunk" with chills for 7 days. Reports dry cough and mildly sore throat; no wheezes, CP, swelling or any other Sx. Review of Systems: Negative except per history of present illness  Physical Exam:  Nursing notes and vitals reviewed General:  alert, well-developed, and cooperative to examination.   HEENT: Right maxillary sinus TTP noted; Postnasal drip noted. Lungs:  normal respiratory effort, no accessory muscle use, normal breath sounds, no crackles, and no wheezes. Heart:  normal rate, regular rhythm, no murmurs, no gallop, and no rub.   Abdomen:  soft, non-tender, normal bowel sounds, no distention, no guarding, no rebound tenderness, no hepatomegaly, and no splenomegaly.   Extremities:  No cyanosis, clubbing, edema Neurologic:  alert & oriented X3, nonfocal exam  Meds:  (Not in a hospital admission)  Allergies: Review of patient's allergies indicates no known allergies. Past Medical History  Diagnosis Date  . Blood in stool     normal colonoscopy 2007  . Chronic fatigue syndrome   . OCD (obsessive compulsive disorder)   . Allergic rhinitis   . Osteoporosis   . Vitamin d deficiency   . Migraine   . Anxiety   . Multiple chemical sensitivity syndrome    Past Surgical History  Procedure Date  . Breast enhancement surgery     bilat, 1973   Family History  Problem Relation Age of Onset  . ALS Father   . Heart disease Father   . Colon polyps Mother   . Heart attack Mother   . Breast cancer Mother    History   Social History  . Marital Status: Legally Separated    Spouse Name: N/A    Number of Children: 2  . Years of Education: N/A   Occupational History  . Housekeeping    Social History Main Topics  . Smoking status: Never Smoker   . Smokeless tobacco: Never Used  . Alcohol Use: No  .  Drug Use: No  . Sexually Active: Not on file   Other Topics Concern  . Not on file   Social History Narrative   Patient is a Scientist, product/process development.    A/P: 1. Acute sinusitis - Flonase NS -Z-pack -avoid exposure to cold temperatures -f/u with Dr. Dorise Hiss (PCP) in 10 days if no improvement.

## 2012-05-19 ENCOUNTER — Other Ambulatory Visit: Payer: Self-pay | Admitting: Internal Medicine

## 2012-05-27 ENCOUNTER — Other Ambulatory Visit: Payer: Self-pay | Admitting: Internal Medicine

## 2012-07-28 ENCOUNTER — Other Ambulatory Visit: Payer: Self-pay | Admitting: *Deleted

## 2012-07-29 MED ORDER — POLYETHYLENE GLYCOL 3350 17 G PO PACK: 17.0000 g | PACK | Freq: Every day | ORAL | Status: DC

## 2012-08-02 ENCOUNTER — Encounter: Payer: Self-pay | Admitting: Internal Medicine

## 2012-08-02 ENCOUNTER — Ambulatory Visit (INDEPENDENT_AMBULATORY_CARE_PROVIDER_SITE_OTHER): Payer: Medicare Other | Admitting: Internal Medicine

## 2012-08-02 VITALS — BP 108/68 | HR 76 | Temp 97.0°F | Ht 60.0 in | Wt 124.6 lb

## 2012-08-02 DIAGNOSIS — E789 Disorder of lipoprotein metabolism, unspecified: Secondary | ICD-10-CM | POA: Diagnosis not present

## 2012-08-02 DIAGNOSIS — F429 Obsessive-compulsive disorder, unspecified: Secondary | ICD-10-CM | POA: Diagnosis not present

## 2012-08-02 DIAGNOSIS — J309 Allergic rhinitis, unspecified: Secondary | ICD-10-CM

## 2012-08-02 DIAGNOSIS — Z0000 Encounter for general adult medical examination without abnormal findings: Secondary | ICD-10-CM | POA: Diagnosis not present

## 2012-08-02 DIAGNOSIS — M549 Dorsalgia, unspecified: Secondary | ICD-10-CM

## 2012-08-02 LAB — LIPID PANEL
HDL: 83 mg/dL (ref >39)
LDL Cholesterol: 138 mg/dL — ABNORMAL HIGH (ref 0–99)
Triglycerides: 68 mg/dL (ref <150)
VLDL: 14 mg/dL (ref 0–40)

## 2012-08-02 MED ORDER — POLYETHYLENE GLYCOL 3350 17 G PO PACK: 17.0000 g | PACK | Freq: Every day | ORAL | Status: DC

## 2012-08-02 MED ORDER — FLUVOXAMINE MALEATE 50 MG PO TABS: 50.0000 mg | ORAL_TABLET | Freq: Three times a day (TID) | ORAL | Status: DC

## 2012-08-02 NOTE — Assessment & Plan Note (Signed)
Takes Flonase as needed.

## 2012-08-02 NOTE — Assessment & Plan Note (Signed)
Stable, takes tylenol occasionally for the pain.

## 2012-08-02 NOTE — Assessment & Plan Note (Signed)
Patient uses the Luvox when necessary. Her daughter would like her to take it more frequently however she manages to deal with life without it.

## 2012-08-02 NOTE — Progress Notes (Signed)
Subjective:     Patient ID: Shelly Campbell, female   DOB: 1941/01/17, 71 y.o.   MRN: [id number]  HPI The patient is a 71 year old female who comes in today for a routine followup visit. She is not having any new complaints or problems at this time. She states that she is not taking her Luvox currently and her daughter would like her to. She states that she is using Jehovah as her rock to help her with her problems. She has not had any chest pain, shortness of breath, nausea, vomiting, diarrhea. She would like to have Pap at today's visit however I did explain that she is not due for a Pap smear. She would also like to have mammography done but would like MRN status x-ray as she has had breast implants this does make it exquisitely tender for her to get mammogram. She does have occasional back pains due to the lots of walking she does to spread the good word for [employer]hov[employer].  Review of Systems  Constitutional: Negative.   HENT: Negative.   Respiratory: Negative for cough, chest tightness, shortness of breath and wheezing.   Cardiovascular: Negative for chest pain, palpitations and leg swelling.  Gastrointestinal: Negative for nausea, vomiting, abdominal pain, diarrhea and constipation.  Musculoskeletal: Positive for back pain. Negative for myalgias, joint swelling, arthralgias and gait problem.  Skin: Negative.   Neurological: Negative.        Objective:   Physical Exam  Constitutional: She is oriented to person, place, and time. She appears well-developed and well-nourished.  HENT:  Head: Normocephalic and atraumatic.  Eyes: EOM are normal. Pupils are equal, round, and reactive to light.  Neck: Normal range of motion. Neck supple.  Cardiovascular: Normal rate and regular rhythm.   Pulmonary/Chest: Effort normal and breath sounds normal.  Abdominal: Soft. Bowel sounds are normal.  Musculoskeletal: Normal range of motion.  Neurological: She is alert and oriented to person, place, and time.         Assessment/Plan:    1. Please see problem oriented charting.  2. Disposition-the patient be seen back in one year. Did check lipid panel at today's visit. We'll try to organize MR mammography for her. No Pap smear needed at today's visit and if she is over 65 she is not indicated. Will refill her fluoxetine and MiraLAX at today's visit.

## 2012-08-02 NOTE — Patient Instructions (Addendum)
You were seen for a follow up today and we refilled your medicines. You do not need a pap smear today. We will see you back in 1 year if you are not having any problems. If you are or need refills please feel free to call our office. Our number is [phone].

## 2012-09-28 DIAGNOSIS — Z23 Encounter for immunization: Secondary | ICD-10-CM | POA: Diagnosis not present

## 2013-03-06 ENCOUNTER — Telehealth: Payer: Self-pay | Admitting: *Deleted

## 2013-03-06 NOTE — Telephone Encounter (Signed)
Call to [facility] for Prior Authorization on Fluvoxamine 50 mg.  Talked with Operator [phone] insurance requests change to next higher dosing and to consider having pt take 1.5 tablets of the next dosage which would not require Prior Authorization.  Message to be sent to Dr. Dorise Hiss for consideration and change if needed.  Angelina Ok, RN 3/

## 2013-05-29 ENCOUNTER — Telehealth: Payer: Self-pay | Admitting: *Deleted

## 2013-05-29 MED ORDER — FLUVOXAMINE MALEATE 50 MG PO TABS: ORAL_TABLET | ORAL | Status: DC

## 2013-05-29 NOTE — Telephone Encounter (Signed)
Call to Dr. Dorise Hiss informed her that pt's current dosing of Luvox 50 mg is not covered by pt's insurance.  Change in dosing to 100 mg tablets was suggested by pt's pharmacy so that insurance would cover without a Prior Authorization.   Pt's medication was changed to Luvox 100 mg tablets 1 tablet in the am and 1/2 tablet in the pm.  Prescription was called to the [facility] on [address] # 45 tablets with 3 refills. Pt was called and informed that prescription had been called to the [facility] on [address].   Pt to call if problems with medication dosing change.  Angelina Ok, RN 05/29/2013 4:47 PM

## 2013-05-29 NOTE — Telephone Encounter (Signed)
Agree and order placed in epic and medications updated with new dosing and instructions.   Genella Mech 6:58 PM 05/29/2013

## 2013-06-05 ENCOUNTER — Other Ambulatory Visit: Payer: Self-pay | Admitting: *Deleted

## 2013-06-06 MED ORDER — SUMATRIPTAN SUCCINATE 100 MG PO TABS: 50.0000 mg | ORAL_TABLET | ORAL | Status: DC | PRN

## 2013-07-17 ENCOUNTER — Ambulatory Visit (INDEPENDENT_AMBULATORY_CARE_PROVIDER_SITE_OTHER): Payer: Medicare Other | Admitting: Internal Medicine

## 2013-07-17 ENCOUNTER — Ambulatory Visit: Payer: Medicare Other | Admitting: Internal Medicine

## 2013-07-17 ENCOUNTER — Encounter: Payer: Self-pay | Admitting: Internal Medicine

## 2013-07-17 VITALS — BP 110/70 | HR 66 | Temp 97.5°F | Ht 66.0 in | Wt 122.4 lb

## 2013-07-17 DIAGNOSIS — F429 Obsessive-compulsive disorder, unspecified: Secondary | ICD-10-CM

## 2013-07-17 DIAGNOSIS — M810 Age-related osteoporosis without current pathological fracture: Secondary | ICD-10-CM | POA: Diagnosis not present

## 2013-07-17 DIAGNOSIS — Z0000 Encounter for general adult medical examination without abnormal findings: Secondary | ICD-10-CM | POA: Diagnosis not present

## 2013-07-17 DIAGNOSIS — L821 Other seborrheic keratosis: Secondary | ICD-10-CM

## 2013-07-17 DIAGNOSIS — Z85828 Personal history of other malignant neoplasm of skin: Secondary | ICD-10-CM | POA: Insufficient documentation

## 2013-07-17 DIAGNOSIS — Z79899 Other long term (current) drug therapy: Secondary | ICD-10-CM | POA: Diagnosis not present

## 2013-07-17 LAB — VITAMIN B12: Vitamin B-12: 1439 pg/mL — ABNORMAL HIGH (ref 211–911)

## 2013-07-17 MED ORDER — CALCIUM CARBONATE-VIT D-MIN 1200-1000 MG-UNIT PO CHEW: 1.0000 | CHEWABLE_TABLET | Freq: Every day | ORAL | Status: DC

## 2013-07-17 NOTE — Assessment & Plan Note (Addendum)
Per DEXA 2009.  Patient is taking vitamin D 2K units  Will repeat DEXA scan Consider Fosamax if still + osteoporosis but prior to starting Fosamax check inside mandible to see if patient has osteonecrosis  Check vitamin D, and B12 level today  For now will continue vitamin D 2K units and add Calcium 1200 mg daily but d/c in the future if add a bisphophonate

## 2013-07-17 NOTE — Assessment & Plan Note (Signed)
Ordered MRI breast for screening to see if her insurance will cover since she has breast implants and mammogram is too painful for the patient If insurance will not cover will d/c the order and PCP will need to discuss mammogram with the patient.

## 2013-07-17 NOTE — Assessment & Plan Note (Signed)
Multiple to trunk  Given ed. Info

## 2013-07-17 NOTE — Assessment & Plan Note (Signed)
H/o basal cell cancer that was treated years ago 2 lesions left nostril and forehead will refer to dermatology to see if NMSC

## 2013-07-17 NOTE — Assessment & Plan Note (Signed)
Patient increased her Luvox from 100 to 150 to 200 mg rapidly Now with head tremor with turning head Discussed with the pharmacist and reviewed Epocrates and Up To date which medication can cause tremor and tardive dyskinesia.  Will decrease patient to 50 mg qam and 150 mg qhs.  Return in 7 days if still with tremor will decreased to 50 mg bid after 7 days.

## 2013-07-17 NOTE — Patient Instructions (Addendum)
Take 50 mg Luvox in the am and 100 mg at night for 7 days Revisit in 7 days for follow up  Repeat DEXA scan.  Take 1200 mg of Calcium per day Continue to take vitamin D and B12  Follow up with Dermatology  Seborrheic Keratosis Seborrheic keratosis is a common, noncancerous (benign) skin growth that can occur anywhere on the skin.It looks like "stuck-on," waxy, rough, tan, brown, or black spots on the skin. These skin growths can be flat or raised.They are often called "barnacles" because of their pasted-on appearance.Usually, these skin growths appear in adulthood, around age 34, and increase in number as you age. They may also develop during pregnancy or following estrogen therapy. Many people may only have one growth appear in their lifetime, while some people may develop many growths. CAUSES It is unknown what causes these skin growths, but they appear to run in families. SYMPTOMS Seborrheic keratosis is often located on the face, chest, shoulders, back, or other areas. These growths are:  Usually painless, but may become irritated and itchy.  Yellow, brown, black, or other colors.  Slightly raised or have a flat surface.  Sometimes rough or wart-like in texture.  Often waxy on the surface.  Round or oval-shaped.  Sometimes "stuck-on" in appearance.  Sometimes single, but there are usually many growths. Any growth that bleeds, itches on a regular basis, becomes inflamed, or becomes irritated needs to be evaluated by a skin specialist (dermatologist). DIAGNOSIS Diagnosis is mainly based on the way the growths appear. In some cases, it can be difficult to tell this type of skin growth from skin cancer. A skin growth tissue sample (biopsy) may be used to confirm the diagnosis. TREATMENT Most often, treatment is not needed because the skin growths are benign.If the skin growth is irritated easily by clothing or jewelry, causing it to scab or bleed, treatment may be recommended.  Patients may also choose to have the growths removed because they do not like their appearance. Most commonly, these growths are treated with cryosurgery. In cryosurgery, liquid nitrogen is applied to "freeze" the growth. The growth usually falls off within a matter of days. A blister may form and dry into a scab that will also fall off. After the growth or scab falls off, it may leave a dark or light spot on the skin. This color may fade over time, or it may remain permanent on the skin. HOME CARE INSTRUCTIONS If the skin growths are treated with cryosurgery, the treated area needs to be kept clean with water and soap. SEEK MEDICAL CARE IF:  You have questions about these growths or other skin problems.  You develop new symptoms, including:  A change in the appearance of the skin growth.  New growths.  Any bleeding, itching, or pain in the growths.  A skin growth that looks similar to seborrheic keratosis. Document Released: 01/16/2011 Document Revised: 03/07/2012 Document Reviewed: 01/16/2011 [facility] Patient Information 2014 [place], [place].

## 2013-07-17 NOTE — Progress Notes (Signed)
  Subjective:    Patient ID: Shelly Campbell, female    DOB: [birth year], 72 y.o.   MRN: [id number]  HPI Comments: 72 y.o PMH OCD, anxiety, migraine, osteoporosis (by DEXA 2009), Jehovahs witness, GERD, dysphagia and schatzki's ring.  She presents for a lesion on her left nose which has been there for months.  The area changes and at times is raised, red.  She denies pain or drainage.  She also has a lesion on her forehead when asked she states has been there for years and is not changing.  She has had a left temple basal cell.  She asks about lesions to back which she cant see.   BP 110/70 HR 66   She also brings up that she is having head tremors at rest. She denies family history.  Head tremor has been going on for a few months and she notices head tremor more when she turns her head a certain way.  Recently she increased Luvox to 100 mg bid from 100 mg qd and briefly took 150 mg total daily.  Reviewed Epocrates and up to date and tardive dyskinesia and tremor are side effects of Luvox.    Patient has osteoporosis by DEXA 2009 and has never taken a medication for osteoporosis i.e Fosamax.  She does take B12 2500 daily and Vitamin D 2K units qd.    Health maintenance: patient is due for mammogram but requesting breast MRI due to pain experienced with mammogram.  She is unsure if insurance will cover.    Other ROS per HPI     Review of Systems  Cardiovascular: Positive for leg swelling.       Intermittent leg edema   Musculoskeletal: Positive for arthralgias.       Intermittent left knee pain        Objective:   Physical Exam  Nursing note and vitals reviewed. Constitutional: She is oriented to person, place, and time. Vital signs are normal. She appears well-developed and well-nourished. She is cooperative. No distress.  HENT:  Head: Normocephalic and atraumatic.  Mouth/Throat: Oropharynx is clear and moist and mucous membranes are normal. No oropharyngeal exudate.  Eyes:  Conjunctivae are normal. Right eye exhibits no discharge. Left eye exhibits no discharge. No scleral icterus.  Cardiovascular: Normal rate, regular rhythm, S1 normal, S2 normal and normal heart sounds.   No murmur heard. Pulmonary/Chest: Effort normal and breath sounds normal. No respiratory distress. She has no wheezes.  No lower extremity edema Benign Seborrheic keratoses to back  Neurological: She is alert and oriented to person, place, and time. Gait normal.  Skin: Skin is warm and dry. No rash noted. She is not diaphoretic.     Psychiatric: She has a normal mood and affect. Her speech is normal and behavior is normal. Judgment and thought content normal. Cognition and memory are normal.          Assessment & Plan:  F/u 1 week, review labs, if still with head tremor decrease Luvox to 50 mg bid.

## 2013-07-18 ENCOUNTER — Encounter: Payer: Self-pay | Admitting: Internal Medicine

## 2013-07-18 ENCOUNTER — Telehealth: Payer: Self-pay | Admitting: *Deleted

## 2013-07-18 NOTE — Telephone Encounter (Signed)
Pt was called and informed of appointment on 07/28/2013 at 1:45 PM for her Dexa Scan with Mammogram to follow at [facility].  Pt was advised to hold [facility] Vitamin with calcium the day before the Scan and to wear 2 piece clothing.  No perfumes,. Deodorants or powders.  Angelina Ok, RN 07/18/2013 1:48 PM.

## 2013-07-18 NOTE — Progress Notes (Signed)
I personally saw this patient with Dr. Shirlee Latch at the time of the visit.  We reviewed the resident's history and exam and pertinent patient test results.  I agree with the assessment, diagnosis, and plan of care documented in the resident's note.

## 2013-07-18 NOTE — Addendum Note (Signed)
Addended by: Annett Gula on: 07/18/2013 12:04 PM   Modules accepted: Orders

## 2013-07-19 ENCOUNTER — Ambulatory Visit (HOSPITAL_COMMUNITY): Payer: Medicare Other

## 2013-07-20 ENCOUNTER — Encounter: Payer: Self-pay | Admitting: Internal Medicine

## 2013-07-28 ENCOUNTER — Ambulatory Visit (HOSPITAL_COMMUNITY)
Admission: RE | Admit: 2013-07-28 | Discharge: 2013-07-28 | Disposition: A | Discharge status: Home / Self Care | Payer: Medicare Other | Source: Ambulatory Visit | Attending: Internal Medicine | Admitting: Internal Medicine

## 2013-07-28 DIAGNOSIS — Z780 Asymptomatic menopausal state: Secondary | ICD-10-CM | POA: Insufficient documentation

## 2013-07-28 DIAGNOSIS — Z978 Presence of other specified devices: Secondary | ICD-10-CM | POA: Insufficient documentation

## 2013-07-28 DIAGNOSIS — Z13820 Encounter for screening for osteoporosis: Secondary | ICD-10-CM | POA: Insufficient documentation

## 2013-07-28 DIAGNOSIS — M810 Age-related osteoporosis without current pathological fracture: Secondary | ICD-10-CM | POA: Diagnosis not present

## 2013-07-28 DIAGNOSIS — Z1231 Encounter for screening mammogram for malignant neoplasm of breast: Secondary | ICD-10-CM

## 2013-07-28 DIAGNOSIS — Z0000 Encounter for general adult medical examination without abnormal findings: Secondary | ICD-10-CM

## 2013-08-02 ENCOUNTER — Other Ambulatory Visit: Payer: Self-pay

## 2013-08-11 ENCOUNTER — Ambulatory Visit (INDEPENDENT_AMBULATORY_CARE_PROVIDER_SITE_OTHER): Payer: Medicare Other | Admitting: Internal Medicine

## 2013-08-11 ENCOUNTER — Encounter: Payer: Self-pay | Admitting: Internal Medicine

## 2013-08-11 VITALS — BP 115/71 | HR 57 | Temp 97.4°F | Ht 64.75 in | Wt 119.1 lb

## 2013-08-11 DIAGNOSIS — D509 Iron deficiency anemia, unspecified: Secondary | ICD-10-CM

## 2013-08-11 DIAGNOSIS — M810 Age-related osteoporosis without current pathological fracture: Secondary | ICD-10-CM | POA: Diagnosis not present

## 2013-08-11 DIAGNOSIS — F429 Obsessive-compulsive disorder, unspecified: Secondary | ICD-10-CM

## 2013-08-11 DIAGNOSIS — Z23 Encounter for immunization: Secondary | ICD-10-CM

## 2013-08-11 DIAGNOSIS — Z299 Encounter for prophylactic measures, unspecified: Secondary | ICD-10-CM

## 2013-08-11 LAB — CBC
HCT: 39.3 % (ref 36.0–46.0)
Hemoglobin: 13.8 g/dL (ref 12.0–15.0)
MCV: 92.3 fL (ref 78.0–100.0)
Platelets: 173 10*3/uL (ref 150–400)
RBC: 4.26 MIL/uL (ref 3.87–5.11)
RDW: 13.4 % (ref 11.5–15.5)
WBC: 4.6 10*3/uL (ref 4.0–10.5)

## 2013-08-11 MED ORDER — PNEUMOCOCCAL VAC POLYVALENT 25 MCG/0.5ML IJ INJ: 0.5000 mL | INJECTION | Freq: Once | INTRAMUSCULAR | Status: DC

## 2013-08-11 NOTE — Patient Instructions (Addendum)
We will see you back in 6 months to 1 year for a check up.   We have given you a tetanus and a pneumonia shot today.   We recommend that you start taking calcium every day (you can use the calcium chews if you cannot swallow the pills).  We have not changed your medicines today but will check your blood counts (hemoglobin levels)

## 2013-08-12 NOTE — Progress Notes (Signed)
Subjective:     Patient ID: Shelly Campbell, female   DOB: 10-28-1941, 72 y.o.   MRN: [id number]  HPI  The patient is a 72 year old female who comes in today for a routine followup visit. She is not having any new complaints or problems at this time. She states that she is not taking her Luvox often although her daughter would like her to. She states that she is using Jehovah as her rock to help her with her problems. She continues to proselytize to the community for Jehovah and this keeps her active. She has not had any chest pain, shortness of breath, nausea, vomiting, diarrhea. She would like to have Pap at today's visit however I did explain that she is not due for a Pap smear. She has chronic fatigue and would like to check that her blood counts are not low as they have been in the distant past but not recently. She is not feeling lightheaded, SOB, faint.   Review of Systems  Constitutional: Negative.   HENT: Negative.   Respiratory: Negative for cough, chest tightness, shortness of breath and wheezing.   Cardiovascular: Negative for chest pain, palpitations and leg swelling.  Gastrointestinal: Negative for nausea, vomiting, abdominal pain, diarrhea and constipation.  Musculoskeletal: Positive for back pain. Negative for myalgias, joint swelling, arthralgias and gait problem.  Skin: Negative.   Neurological: Negative.        Objective:   Physical Exam  Constitutional: She is oriented to person, place, and time. She appears well-developed and well-nourished.  HENT:  Head: Normocephalic and atraumatic.  Eyes: EOM are normal. Pupils are equal, round, and reactive to light.  Neck: Normal range of motion. Neck supple.  Cardiovascular: Normal rate and regular rhythm.   Pulmonary/Chest: Effort normal and breath sounds normal.  Abdominal: Soft. Bowel sounds are normal.  Musculoskeletal: Normal range of motion.  Neurological: She is alert and oriented to person, place, and time.        Assessment/Plan:    1. Please see problem oriented charting.  2. Disposition-the patient be seen back in 6 months to one year.  She will get pneumovax and TDap at today's visit. No Pap smear needed at today's visit and if she is over 65 she is not indicated. Will check CBC at today's visit.

## 2013-08-12 NOTE — Assessment & Plan Note (Signed)
Advised her to continue with vitamin D replacement and also add calcium. She did not wish to start any kind of further therapy at this time due to reluctance to take pills although would be worth continuing the talk at next visit. She may agree to once yearly IV infusion therapy.

## 2013-08-12 NOTE — Assessment & Plan Note (Signed)
Continue luvox as she wishes to take it. She does seem to have some OCD features. No breakdown of skin (sign of recurrent washing) or repeating thoughts numerous times. She does seem to take some strength from her faith and it seems to help calm her mind at times.

## 2013-08-15 NOTE — Progress Notes (Signed)
Case discussed with Dr. Kollar soon after the resident saw the patient.  We reviewed the resident's history and exam and pertinent patient test results.  I agree with the assessment, diagnosis, and plan of care documented in the resident's note. 

## 2013-09-05 DIAGNOSIS — Z23 Encounter for immunization: Secondary | ICD-10-CM | POA: Diagnosis not present

## 2013-09-11 DIAGNOSIS — H40059 Ocular hypertension, unspecified eye: Secondary | ICD-10-CM | POA: Diagnosis not present

## 2013-11-16 ENCOUNTER — Other Ambulatory Visit: Payer: Self-pay | Admitting: *Deleted

## 2013-11-16 MED ORDER — SUMATRIPTAN SUCCINATE 100 MG PO TABS: 50.0000 mg | ORAL_TABLET | ORAL | Status: DC | PRN

## 2014-04-16 ENCOUNTER — Other Ambulatory Visit: Payer: Self-pay | Admitting: *Deleted

## 2014-04-17 MED ORDER — FLUVOXAMINE MALEATE 50 MG PO TABS: ORAL_TABLET | ORAL | Status: DC

## 2014-04-17 NOTE — Telephone Encounter (Signed)
Rx called in to pharmacy and sent a note to Dr Doug Sou - dosage was changed from 50mg  to 100mg  due to directions given.

## 2014-05-03 ENCOUNTER — Encounter: Payer: Medicare Other | Admitting: Internal Medicine

## 2014-05-05 ENCOUNTER — Other Ambulatory Visit: Payer: Self-pay | Admitting: Internal Medicine

## 2014-05-05 DIAGNOSIS — K5909 Other constipation: Secondary | ICD-10-CM

## 2014-08-18 ENCOUNTER — Other Ambulatory Visit: Payer: Self-pay | Admitting: Internal Medicine

## 2014-08-24 ENCOUNTER — Ambulatory Visit (INDEPENDENT_AMBULATORY_CARE_PROVIDER_SITE_OTHER): Payer: Medicare Other | Admitting: Internal Medicine

## 2014-08-24 ENCOUNTER — Encounter: Payer: Self-pay | Admitting: Internal Medicine

## 2014-08-24 VITALS — BP 100/62 | HR 77 | Temp 98.2°F | Ht 64.75 in | Wt 125.5 lb

## 2014-08-24 DIAGNOSIS — K5900 Constipation, unspecified: Secondary | ICD-10-CM | POA: Diagnosis not present

## 2014-08-24 DIAGNOSIS — E785 Hyperlipidemia, unspecified: Secondary | ICD-10-CM | POA: Diagnosis not present

## 2014-08-24 DIAGNOSIS — Z0000 Encounter for general adult medical examination without abnormal findings: Secondary | ICD-10-CM

## 2014-08-24 DIAGNOSIS — G43809 Other migraine, not intractable, without status migrainosus: Secondary | ICD-10-CM

## 2014-08-24 DIAGNOSIS — F429 Obsessive-compulsive disorder, unspecified: Secondary | ICD-10-CM

## 2014-08-24 DIAGNOSIS — E559 Vitamin D deficiency, unspecified: Secondary | ICD-10-CM | POA: Diagnosis not present

## 2014-08-24 DIAGNOSIS — K5909 Other constipation: Secondary | ICD-10-CM

## 2014-08-24 DIAGNOSIS — M810 Age-related osteoporosis without current pathological fracture: Secondary | ICD-10-CM

## 2014-08-24 LAB — COMPLETE METABOLIC PANEL WITH GFR
ALBUMIN: 4.3 g/dL (ref 3.5–5.2)
ALK PHOS: 72 U/L (ref 39–117)
ALT: 20 U/L (ref 0–35)
AST: 22 U/L (ref 0–37)
BILIRUBIN TOTAL: 0.3 mg/dL (ref 0.2–1.2)
BUN: 23 mg/dL (ref 6–23)
CO2: 31 mEq/L (ref 19–32)
Calcium: 9.7 mg/dL (ref 8.4–10.5)
Chloride: 103 mEq/L (ref 96–112)
Creat: 0.69 mg/dL (ref 0.50–1.10)
GFR, EST NON AFRICAN AMERICAN: 86 mL/min
GFR, Est African American: >89 mL/min
Glucose, Bld: 78 mg/dL (ref 70–99)
POTASSIUM: 5 meq/L (ref 3.5–5.3)
SODIUM: 140 meq/L (ref 135–145)
TOTAL PROTEIN: 6.7 g/dL (ref 6.0–8.3)

## 2014-08-24 LAB — CBC
HCT: 38.7 % (ref 36.0–46.0)
Hemoglobin: 13.7 g/dL (ref 12.0–15.0)
MCH: 32.2 pg (ref 26.0–34.0)
MCHC: 35.4 g/dL (ref 30.0–36.0)
MCV: 91.1 fL (ref 78.0–100.0)
PLATELETS: 180 10*3/uL (ref 150–400)
RBC: 4.25 MIL/uL (ref 3.87–5.11)
RDW: 13.2 % (ref 11.5–15.5)
WBC: 4.4 10*3/uL (ref 4.0–10.5)

## 2014-08-24 LAB — LIPID PANEL
CHOL/HDL RATIO: 2.2 ratio
CHOLESTEROL: 165 mg/dL (ref 0–200)
HDL: 75 mg/dL (ref >39)
LDL Cholesterol: 61 mg/dL (ref 0–99)
Triglycerides: 147 mg/dL (ref <150)
VLDL: 29 mg/dL (ref 0–40)

## 2014-08-24 MED ORDER — ZOSTER VACCINE LIVE 19400 UNT/0.65ML ~~LOC~~ SOLR: 0.6500 mL | Freq: Once | SUBCUTANEOUS | Status: DC

## 2014-08-24 MED ORDER — FLUVOXAMINE MALEATE 50 MG PO TABS: ORAL_TABLET | ORAL | Status: DC

## 2014-08-24 MED ORDER — SUMATRIPTAN SUCCINATE 100 MG PO TABS: ORAL_TABLET | ORAL | Status: DC

## 2014-08-24 MED ORDER — POLYETHYLENE GLYCOL 3350 17 GM/SCOOP PO POWD: 17.0000 g | Freq: Every day | ORAL | Status: DC

## 2014-08-24 NOTE — Patient Instructions (Addendum)
-  Will check your labs today and schedule you for a DEXA scan  -I refilled your medications  -Pleasure meeting you, will see you back in 6 months   Osteoporosis Osteoporosis happens when your bones become weak because of bone loss. Weak bones can break (fracture) more easily with slips or falls. You are more likely to develop osteoporosis if:  You are a woman.  You are older than 50 years.  You are white or Asian.  You are very thin.  Someone in your family has had osteoporosis.  You smoke or use nicotine. CAUSES   Smoking.  Too much drinking.  Being a weight below normal.  Not being active.  Not going outside in the sun enough.  Certain medical conditions, such as diabetes or Crohn disease.  Certain medicines, such as steroids or antiseizure medicines. TREATMENT  The goal of treatment is to strengthen bones. There are different types of medicines that help your bones. Some medicines make your bones more solid. Some medicines help to slow down how much bone you lose. Your doctor may check to see if you are getting enough calcium and vitamin D in your diet. PREVENTION   Make sure you get enough calcium and vitamin D.  Make sure you exercise often.  If you smoke, quit. MAKE SURE YOU:  Understand these instructions.  Will watch your condition.  Will get help right away if you are not doing well or get worse. Document Released: 03/07/2012 Document Reviewed: 03/07/2012 [facility] Patient Information 2015 [place]. This information is not intended to replace advice given to you by your health care provider. Make sure you discuss any questions you have with your health care provider.   General Instructions:   Please bring your medicines with you each time you come to clinic.  Medicines may include prescription medications, over-the-counter medications, herbal remedies, eye drops, vitamins, or other pills.   Progress Toward Treatment Goals:  No flowsheet  data found.  Self Care Goals & Plans:  No flowsheet data found.  No flowsheet data found.   Care Management & Community Referrals:  No flowsheet data found.

## 2014-08-24 NOTE — Progress Notes (Signed)
Patient ID: Shelly Campbell, female   DOB: 1941/08/20, 73 y.o.   MRN: [id number]    Subjective:   Patient ID: Shelly Campbell female   DOB: [birth year] 73 y.o.   MRN: [id number]  HPI: [name] is a 73 y.o. very pleasant woman with past medical history of OCD, osteoporosis, migraine headaches, and allergic rhinitis who presents for routine follow-up visit.   She reports her OCD which manifests in checking to see if the stove is off is well-controlled on luvox which she is compliant with. She denies anxiety or depression.   She has a history of osteoporosis with last DEXA scan in 2009 and vitamin D insufficiency. She is compliant with taking vitamin D 2000 U daily and 1200 mg of calcium daily. She denies recent fall or history of fracture. She stays active and exercises daily.   She has a history of migraine headaches without aura. She has them about three times monthly and are located behind her left eye with photosensitivity that is debilitating. She denies nausea or vomiting with the headaches. She takes imitrex when the migraine first begins which relieves the pain in approximately 20 minutes. She denies history of stroke.   She reports feeling cold all the time and would like to check her thyroid levels. She reports being on replacement therapy in the past. She has a history of chronic constipation and takes miralax daily which helps.     She is unsure if she is due for pap smear testing and would like to have the shingles vaccine.    Past Medical History  Diagnosis Date  . Blood in stool     normal colonoscopy 2007  . Chronic fatigue syndrome   . OCD (obsessive compulsive disorder)   . Allergic rhinitis   . Osteoporosis   . Vitamin D deficiency   . Migraine   . Anxiety   . Multiple chemical sensitivity syndrome   . Osteoporosis   . Basal cell carcinoma     left temple   Current Outpatient Prescriptions  Medication Sig Dispense Refill  . cholecalciferol  (VITAMIN D) 1000 UNITS tablet Take 2,000 Units by mouth daily.      . cyanocobalamin 2000 MCG tablet Take 2,500 mcg by mouth daily.      . fluticasone (FLONASE) 50 MCG/ACT nasal spray Place 1 spray into the nose daily.  16 g  1  . fluvoxaMINE (LUVOX) 50 MG tablet Take 1 tab (100 mg) in the morning and 1/2 tab (50 mg) in the evening.  45 tablet  3  . polyethylene glycol powder (GLYCOLAX/MIRALAX) powder Take 17 g by mouth daily.  527 g  3  . SUMAtriptan (IMITREX) 100 MG tablet take 1/2 tablet by mouth every 2 hours if needed for migraines  9 tablet  6   No current facility-administered medications for this visit.   Family History  Problem Relation Age of Onset  . ALS Father   . Heart disease Father   . Colon polyps Mother   . Heart attack Mother   . Breast cancer Mother    History   Social History  . Marital Status: Legally Separated    Spouse Name: N/A    Number of Children: 2  . Years of Education: N/A   Occupational History  . Housekeeping    Social History Main Topics  . Smoking status: Never Smoker   . Smokeless tobacco: Never Used  . Alcohol Use: No  . Drug Use: No  .  Sexual Activity: Not on file   Other Topics Concern  . Not on file   Social History Narrative   Patient is a Restaurant manager, fast food.   Review of Systems: Review of Systems  Constitutional: Negative for fever and chills.       Weight gain  Eyes: Positive for blurred vision (chronic).  Respiratory: Positive for shortness of breath (occasioanly with exertion). Negative for cough and wheezing.   Cardiovascular: Negative for chest pain and leg swelling.  Gastrointestinal: Positive for constipation. Negative for nausea, vomiting, abdominal pain, diarrhea and blood in stool.  Genitourinary: Negative for dysuria, urgency and frequency.  Musculoskeletal: Positive for back pain (chrinic low back) and myalgias (occasional muscle cramping). Negative for falls and joint pain.  Neurological: Positive for headaches  (chronic migraines). Negative for dizziness and focal weakness.  Endo/Heme/Allergies: Positive for environmental allergies.       "Feeling cold"   Psychiatric/Behavioral: Positive for memory loss. Negative for depression. The patient is not nervous/anxious.     Objective:  Physical Exam: Filed Vitals:   08/24/14 1420  BP: 100/62  Pulse: 77  Temp: 98.2 F (36.8 C)  TempSrc: Oral  Height: 5' 4.75" (1.645 m)  Weight: 125 lb 8 oz (56.926 kg)  SpO2: 97%   Physical Exam  Constitutional: She is oriented to person, place, and time. She appears well-developed and well-nourished. No distress.  Appears younger than stated age  HENT:  Head: Normocephalic and atraumatic.  Right Ear: External ear normal.  Left Ear: External ear normal.  Nose: Nose normal.  Mouth/Throat: Oropharynx is clear and moist. No oropharyngeal exudate.  Eyes: Conjunctivae and EOM are normal. Pupils are equal, round, and reactive to light. Right eye exhibits no discharge. Left eye exhibits no discharge. No scleral icterus.  Neck: Normal range of motion. Neck supple.  Cardiovascular: Normal rate, regular rhythm and normal heart sounds.   Pulmonary/Chest: Effort normal and breath sounds normal. No respiratory distress. She has no wheezes. She has no rales.  Abdominal: Soft. Bowel sounds are normal. She exhibits no distension. There is no tenderness. There is no rebound and no guarding.  Musculoskeletal: Normal range of motion. She exhibits no edema and no tenderness.  Neurological: She is alert and oriented to person, place, and time.  Normal 5/5 muscle strength. Normal sensation to light touch of extremities.   Skin: Skin is warm and dry. No rash noted. She is not diaphoretic. No erythema. No pallor.  Psychiatric: She has a normal mood and affect. Her behavior is normal. Judgment and thought content normal.    Assessment & Plan:   Please see problem list for problem-based assessment and plan

## 2014-08-25 LAB — T4, FREE: FREE T4: 0.93 ng/dL (ref 0.80–1.80)

## 2014-08-25 LAB — VITAMIN D 25 HYDROXY (VIT D DEFICIENCY, FRACTURES): Vit D, 25-Hydroxy: 49 ng/mL (ref 30–89)

## 2014-08-25 LAB — TSH: TSH: 1.819 u[IU]/mL (ref 0.350–4.500)

## 2014-08-26 ENCOUNTER — Encounter: Payer: Self-pay | Admitting: Internal Medicine

## 2014-08-26 DIAGNOSIS — Z0000 Encounter for general adult medical examination without abnormal findings: Secondary | ICD-10-CM | POA: Insufficient documentation

## 2014-08-26 DIAGNOSIS — K5909 Other constipation: Secondary | ICD-10-CM | POA: Insufficient documentation

## 2014-08-26 DIAGNOSIS — E559 Vitamin D deficiency, unspecified: Secondary | ICD-10-CM | POA: Insufficient documentation

## 2014-08-26 DIAGNOSIS — E785 Hyperlipidemia, unspecified: Secondary | ICD-10-CM

## 2014-08-26 HISTORY — DX: Hyperlipidemia, unspecified: E78.5

## 2014-08-26 MED ORDER — CALCIUM CARBONATE 1250 (500 CA) MG PO TABS: 2.0000 | ORAL_TABLET | Freq: Every day | ORAL | Status: DC

## 2014-08-26 MED ORDER — VITAMIN D 50 MCG (2000 UT) PO TABS: 2000.0000 [IU] | ORAL_TABLET | Freq: Every day | ORAL | Status: DC

## 2014-08-26 NOTE — Assessment & Plan Note (Signed)
Assessment: Pt with well-controlled obsessive complusive disorder compliant with SSRI therapy who presents with stable mood.   Plan:  -Refill fluvoxamine 100 mg in AM and 50 mg in PM

## 2014-08-26 NOTE — Assessment & Plan Note (Addendum)
Assessment: Pt with history of chronic constipation with last colonoscopy on 2007 with 2 benign polyps who presents with well-controlled symptoms on laxative therapy.   Plan:  -Pt instructed to increase daily fluid and fiber intake -Refill miralax 17 g daily  -Obtain TSH and free T4 levels ----> normal  -Obtain CBC ----> nornal

## 2014-08-26 NOTE — Assessment & Plan Note (Signed)
Assessment: Pt with vitamin D insufficiency with last level normal (57) on 07/17/13 compliant with vitamin D therapy who presents with no recent fall or fracture.   Plan:  -Obtain 25-OH vitamin D level ---> normal  -Continue cholecalciferol 2000 U daily

## 2014-08-26 NOTE — Assessment & Plan Note (Signed)
Assessment: Pt with history of migraine headaches without aura who presents with three monthly headaches well-controlled on abortive therapy with no alarm symptoms.   Plan:  -Refill sumatriptan 50 mg Q 2hr PRN migraine headache

## 2014-08-26 NOTE — Assessment & Plan Note (Addendum)
-  Pt given prescription for zoster vaccination.  -Pt stated she will obtain annual influenza vaccination at a later time this year.  -Due to age >72 and no prior history of cervical cancer or abnormal pap smears, pap smearing testing no longer indicated.

## 2014-08-26 NOTE — Assessment & Plan Note (Signed)
Assessment: Pt with last lipid panel on 08/02/12 with hypercholesteremia not on statin therapy with 10-yr ASCVD risk of 8.5% and recommendations to start moderate to high-intensity statin therapy.   Plan:  -Obtain lipid panel ----> normal  -Due to age approaching 89, will not start statin therapy and continue with lifestyle modification -Obtain CMP ----> normal

## 2014-08-26 NOTE — Assessment & Plan Note (Addendum)
Assessment: Pt with history of osteoporosis and vitamin D insufficiency with last DEXA scan in 2009 compliant with calcium/vitamin D therapy who presents with no recent fall or fracture.    Plan:  -Repeat DEXA scan  -Obtain 25-OH vitamin D level ---> normal  -Continue cholecalciferol 2000 U daily and increase calcium carbonate 1250 mg daily to BID

## 2014-08-30 NOTE — Progress Notes (Signed)
INTERNAL MEDICINE TEACHING ATTENDING ADDENDUM - Silveria Botz, MD: I reviewed and discussed at the time of visit with the resident Dr. Rabbani, the patient's medical history, physical examination, diagnosis and results of pertinent tests and treatment and I agree with the patient's care as documented.  

## 2014-09-10 ENCOUNTER — Other Ambulatory Visit: Payer: Self-pay | Admitting: Internal Medicine

## 2014-09-15 ENCOUNTER — Other Ambulatory Visit: Payer: Self-pay | Admitting: Internal Medicine

## 2014-09-15 DIAGNOSIS — Z23 Encounter for immunization: Secondary | ICD-10-CM | POA: Diagnosis not present

## 2014-09-18 ENCOUNTER — Other Ambulatory Visit: Payer: Self-pay | Admitting: Internal Medicine

## 2014-09-18 ENCOUNTER — Telehealth: Payer: Self-pay | Admitting: *Deleted

## 2014-09-18 MED ORDER — FLUVOXAMINE MALEATE 100 MG PO TABS: 100.0000 mg | ORAL_TABLET | Freq: Two times a day (BID) | ORAL | Status: DC

## 2014-09-18 NOTE — Telephone Encounter (Signed)
Changed the fluvoxamine to 100 mg BID. Thanks!

## 2014-09-18 NOTE — Telephone Encounter (Signed)
Call from pt - Pt states she take Fluvoxamine 100 mg - one in the morning and one at bedtime. Thanks

## 2014-09-20 DIAGNOSIS — H40059 Ocular hypertension, unspecified eye: Secondary | ICD-10-CM | POA: Diagnosis not present

## 2014-10-08 ENCOUNTER — Other Ambulatory Visit: Payer: Self-pay | Admitting: Internal Medicine

## 2014-10-08 DIAGNOSIS — Z1231 Encounter for screening mammogram for malignant neoplasm of breast: Secondary | ICD-10-CM

## 2014-10-12 ENCOUNTER — Other Ambulatory Visit: Payer: Self-pay | Admitting: Internal Medicine

## 2014-10-12 ENCOUNTER — Ambulatory Visit (HOSPITAL_COMMUNITY)
Admission: RE | Admit: 2014-10-12 | Discharge: 2014-10-12 | Disposition: A | Discharge status: Home / Self Care | Payer: Medicare Other | Source: Ambulatory Visit | Attending: Internal Medicine | Admitting: Internal Medicine

## 2014-10-12 DIAGNOSIS — Z1231 Encounter for screening mammogram for malignant neoplasm of breast: Secondary | ICD-10-CM

## 2014-10-12 DIAGNOSIS — Z9882 Breast implant status: Secondary | ICD-10-CM | POA: Diagnosis not present

## 2015-03-18 ENCOUNTER — Other Ambulatory Visit: Payer: Self-pay | Admitting: *Deleted

## 2015-03-18 MED ORDER — FLUVOXAMINE MALEATE 100 MG PO TABS: 100.0000 mg | ORAL_TABLET | Freq: Two times a day (BID) | ORAL | Status: DC

## 2015-06-24 ENCOUNTER — Other Ambulatory Visit: Payer: Self-pay

## 2015-08-15 ENCOUNTER — Ambulatory Visit (INDEPENDENT_AMBULATORY_CARE_PROVIDER_SITE_OTHER): Payer: Medicare Other | Admitting: Internal Medicine

## 2015-08-15 ENCOUNTER — Encounter: Payer: Self-pay | Admitting: Internal Medicine

## 2015-08-15 VITALS — BP 117/57 | HR 66 | Temp 98.5°F | Ht 65.0 in | Wt 133.0 lb

## 2015-08-15 DIAGNOSIS — N182 Chronic kidney disease, stage 2 (mild): Secondary | ICD-10-CM | POA: Diagnosis not present

## 2015-08-15 DIAGNOSIS — Z23 Encounter for immunization: Secondary | ICD-10-CM | POA: Diagnosis not present

## 2015-08-15 DIAGNOSIS — G43009 Migraine without aura, not intractable, without status migrainosus: Secondary | ICD-10-CM | POA: Diagnosis not present

## 2015-08-15 DIAGNOSIS — G43809 Other migraine, not intractable, without status migrainosus: Secondary | ICD-10-CM | POA: Diagnosis present

## 2015-08-15 DIAGNOSIS — M810 Age-related osteoporosis without current pathological fracture: Secondary | ICD-10-CM

## 2015-08-15 DIAGNOSIS — Z0000 Encounter for general adult medical examination without abnormal findings: Secondary | ICD-10-CM

## 2015-08-15 DIAGNOSIS — F429 Obsessive-compulsive disorder, unspecified: Secondary | ICD-10-CM

## 2015-08-15 DIAGNOSIS — F42 Obsessive-compulsive disorder: Secondary | ICD-10-CM | POA: Diagnosis not present

## 2015-08-15 MED ORDER — SUMATRIPTAN SUCCINATE 100 MG PO TABS: ORAL_TABLET | ORAL | Status: DC

## 2015-08-15 MED ORDER — VITAMIN D 50 MCG (2000 UT) PO TABS: 2000.0000 [IU] | ORAL_TABLET | Freq: Every day | ORAL | Status: DC

## 2015-08-15 MED ORDER — CALCIUM CARBONATE 1250 (500 CA) MG PO TABS: 1.0000 | ORAL_TABLET | Freq: Every day | ORAL | Status: DC

## 2015-08-15 NOTE — Patient Instructions (Signed)
-  I refilled your imitrex, vitamin D 2000 U, and calcium carbonate 1250 mg daily  -Will give you a pneumonia shot today and make sure you get a flu shot in the next coming months  -Will check your bloodwork today and call you with the results -Very nice seeing you today, please come back in 1 year or sooner if you have any problems.   General Instructions:   Please bring your medicines with you each time you come to clinic.  Medicines may include prescription medications, over-the-counter medications, herbal remedies, eye drops, vitamins, or other pills.   Progress Toward Treatment Goals:  No flowsheet data found.  Self Care Goals & Plans:  No flowsheet data found.  No flowsheet data found.   Care Management & Community Referrals:  No flowsheet data found.

## 2015-08-15 NOTE — Progress Notes (Signed)
Internal Medicine Clinic Attending  Case discussed with Dr. Rabbani soon after the resident saw the patient.  We reviewed the resident's history and exam and pertinent patient test results.  I agree with the assessment, diagnosis, and plan of care documented in the resident's note.  

## 2015-08-15 NOTE — Progress Notes (Signed)
Patient ID: Shelly Campbell, female   DOB: [birth year], 74 y.o.   MRN: [id number]    Subjective:   Patient ID: Shelly Campbell female   DOB: 10-22-1941 74 y.o.   MRN: [id number]  HPI: [name] is a 74 y.o.  very pleasant woman with past medical history of OCD, osteoporosis, migraine headaches, and allergic rhinitis who presents for routine follow-up visit.   She reports her OCD which manifests in checking to see if the stove is off is well-controlled on luvox. She denies anxiety or depression.   She has a history of osteoporosis with last DEXA scan in 2014. She is compliant with taking vitamin D and calcium daily. She denies recent fall, fracture, or bone pain. She stays active and exercises daily.   She has a history of migraine headaches without aura with three to five a month that are located behind her left eye with photosensitivity that is debilitating. She also has lightheadedness but denies associated  nausea or vomiting. She takes imitrex when the migraine first begins which aborts the migraine quickly.    She reports receiving the shingles vaccine at her pharmacy last year and would like prevnar and influenza vaccinations.    Past Medical History  Diagnosis Date  . Blood in stool     normal colonoscopy 2007  . Chronic fatigue syndrome   . OCD (obsessive compulsive disorder)   . Allergic rhinitis   . Osteoporosis   . Vitamin D deficiency   . Migraine   . Anxiety   . Multiple chemical sensitivity syndrome   . Osteoporosis   . Basal cell carcinoma     left temple  . Hyperlipidemia 08/26/2014   Current Outpatient Prescriptions  Medication Sig Dispense Refill  . calcium carbonate (OS-CAL - DOSED IN MG OF ELEMENTAL CALCIUM) 1250 MG tablet Take 2 tablets (1,000 mg of elemental calcium total) by mouth daily. 90 tablet 3  . cholecalciferol 2000 UNITS tablet Take 1 tablet (2,000 Units total) by mouth daily. 90 tablet 3  . cyanocobalamin 2000 MCG tablet Take 2,500  mcg by mouth daily.    . fluticasone (FLONASE) 50 MCG/ACT nasal spray Place 1 spray into the nose daily. 16 g 1  . fluvoxaMINE (LUVOX) 100 MG tablet Take 1 tablet (100 mg total) by mouth 2 (two) times daily. 180 tablet 3  . polyethylene glycol powder (GLYCOLAX/MIRALAX) powder Take 17 g by mouth daily. 527 g 5  . SUMAtriptan (IMITREX) 100 MG tablet take 1/2 tablet by mouth every 2 hours if needed for migraines 9 tablet 6  . zoster vaccine live, PF, (ZOSTAVAX) 35009 UNT/0.65ML injection Inject 19,400 Units into the skin once. 1 each 0   No current facility-administered medications for this visit.   Family History  Problem Relation Age of Onset  . ALS Father   . Heart disease Father   . Colon polyps Mother   . Heart attack Mother   . Breast cancer Mother    Social History   Social History  . Marital Status: Legally Separated    Spouse Name: N/A  . Number of Children: 2  . Years of Education: N/A   Occupational History  . Housekeeping    Social History Main Topics  . Smoking status: Never Smoker   . Smokeless tobacco: Never Used  . Alcohol Use: No  . Drug Use: No  . Sexual Activity: Not on file   Other Topics Concern  . Not on file   Social History Narrative  Patient is a Restaurant manager, fast food.   Review of Systems: Review of Systems  Constitutional: Negative for fever, chills and malaise/fatigue.       Weight gain  Eyes: Negative for blurred vision.  Respiratory: Negative for cough, shortness of breath and wheezing.   Cardiovascular: Negative for chest pain and leg swelling.  Gastrointestinal: Positive for constipation (chronic controlled on medical therapy). Negative for nausea, vomiting, abdominal pain, diarrhea, blood in stool and melena.  Genitourinary: Negative for dysuria, urgency, frequency and hematuria.  Musculoskeletal: Negative for falls.       Left popliteal region  Skin: Positive for rash (left thigh).  Neurological: Positive for dizziness (with migraine  headaches) and headaches (migraines).  Psychiatric/Behavioral: Positive for memory loss (normal aging). Negative for depression. The patient is not nervous/anxious.        Well-controlled OCD     Objective:  Physical Exam: Filed Vitals:   08/15/15 1505  BP: 117/57  Pulse: 66  Temp: 98.5 F (36.9 C)  TempSrc: Oral  Height: 5\' 5"  (1.651 m)  Weight: 133 lb (60.328 kg)  SpO2: 98%    Physical Exam  Constitutional: She is oriented to person, place, and time. She appears well-developed and well-nourished. No distress.  HENT:  Head: Normocephalic and atraumatic.  Right Ear: External ear normal.  Left Ear: External ear normal.  Nose: Nose normal.  Mouth/Throat: Oropharynx is clear and moist. No oropharyngeal exudate.  Eyes: Conjunctivae and EOM are normal. Pupils are equal, round, and reactive to light. Right eye exhibits no discharge. Left eye exhibits no discharge. No scleral icterus.  Neck: Normal range of motion. Neck supple.  Cardiovascular: Normal rate, regular rhythm and normal heart sounds.   No murmur heard. Pulmonary/Chest: Effort normal and breath sounds normal. No respiratory distress. She has no wheezes. She has no rales.  Abdominal: Soft. Bowel sounds are normal. She exhibits no distension. There is no tenderness. There is no rebound and no guarding.  Musculoskeletal: Normal range of motion. She exhibits no edema or tenderness.  Neurological: She is alert and oriented to person, place, and time.  Skin: Skin is warm and dry. Rash (papular rash on left thigh) noted. She is not diaphoretic. No erythema. No pallor.  Psychiatric: She has a normal mood and affect. Her behavior is normal. Judgment and thought content normal.    Assessment & Plan:   Please see problem list for problem-based assessment and plan

## 2015-08-16 ENCOUNTER — Encounter: Payer: Self-pay | Admitting: Internal Medicine

## 2015-08-16 DIAGNOSIS — N182 Chronic kidney disease, stage 2 (mild): Secondary | ICD-10-CM | POA: Insufficient documentation

## 2015-08-16 LAB — CBC
Hematocrit: 39.9 % (ref 34.0–46.6)
Hemoglobin: 13.5 g/dL (ref 11.1–15.9)
MCH: 31.8 pg (ref 26.6–33.0)
MCHC: 33.8 g/dL (ref 31.5–35.7)
MCV: 94 fL (ref 79–97)
Platelets: 175 10*3/uL (ref 150–379)
RBC: 4.25 x10E6/uL (ref 3.77–5.28)
RDW: 13.5 % (ref 12.3–15.4)
WBC: 4.6 10*3/uL (ref 3.4–10.8)

## 2015-08-16 LAB — COMPREHENSIVE METABOLIC PANEL
A/G RATIO: 1.8 (ref 1.1–2.5)
ALBUMIN: 4.2 g/dL (ref 3.5–4.8)
ALT: 29 IU/L (ref 0–32)
AST: 29 IU/L (ref 0–40)
Alkaline Phosphatase: 78 IU/L (ref 39–117)
BILIRUBIN TOTAL: 0.3 mg/dL (ref 0.0–1.2)
BUN / CREAT RATIO: 26 (ref 11–26)
BUN: 17 mg/dL (ref 8–27)
CALCIUM: 9.3 mg/dL (ref 8.7–10.3)
CHLORIDE: 100 mmol/L (ref 97–108)
CO2: 25 mmol/L (ref 18–29)
Creatinine, Ser: 0.66 mg/dL (ref 0.57–1.00)
GFR, EST AFRICAN AMERICAN: 100 mL/min/{1.73_m2} (ref >59)
GFR, EST NON AFRICAN AMERICAN: 87 mL/min/{1.73_m2} (ref >59)
GLUCOSE: 87 mg/dL (ref 65–99)
Globulin, Total: 2.4 g/dL (ref 1.5–4.5)
Potassium: 4.5 mmol/L (ref 3.5–5.2)
Sodium: 141 mmol/L (ref 134–144)
TOTAL PROTEIN: 6.6 g/dL (ref 6.0–8.5)

## 2015-08-17 NOTE — Assessment & Plan Note (Addendum)
Assessment: Pt with history of osteoporosis and vitamin D insufficiency with last DEXA scan in 2014 compliant with calcium/vitamin D therapy who presents with no recent fall or fracture.   Plan:  -Refill cholecalciferol 2000 U daily and calcium carbonate 1250 mg daily' -Obtain CMP ---> mild CKD Stage 2  -Obtain CBC w/no diff---> normal

## 2015-08-17 NOTE — Assessment & Plan Note (Signed)
Assessment: Pt with well-controlled obsessive complusive disorder compliant with SSRI therapy who presents with stable mood.   Plan:  -Continue fluvoxamine 100 mg BID

## 2015-08-17 NOTE — Assessment & Plan Note (Addendum)
Assessment: Pt with history of migraine headaches without aura who presents well-controlled headaches on abortive therapy with no alarm symptoms.   Plan:  -Refill sumatriptan 100 mg Q 2hr PRN for abortive therapy (not to exceed 200 mg in 24 hr period, and no contraindications to use) -Consider maintenance therapy with topamax at next visit

## 2015-08-17 NOTE — Assessment & Plan Note (Addendum)
-  Pt given prevnar vaccination on 08/16/15  -Pt received shingles vaccination on 09/15/14, confirmed with pharmacy  -Pt to return for annual influenza vaccination (not yet available)

## 2015-08-17 NOTE — Addendum Note (Signed)
Addended byJuluis Mire on: 08/17/2015 02:14 PM   Modules accepted: Orders

## 2015-09-10 ENCOUNTER — Ambulatory Visit (INDEPENDENT_AMBULATORY_CARE_PROVIDER_SITE_OTHER): Payer: Medicare Other | Admitting: Internal Medicine

## 2015-09-10 ENCOUNTER — Encounter: Payer: Self-pay | Admitting: Internal Medicine

## 2015-09-10 VITALS — BP 115/66 | HR 71 | Temp 98.4°F | Ht 65.0 in | Wt 129.7 lb

## 2015-09-10 DIAGNOSIS — K5900 Constipation, unspecified: Secondary | ICD-10-CM | POA: Diagnosis not present

## 2015-09-10 DIAGNOSIS — K5909 Other constipation: Secondary | ICD-10-CM

## 2015-09-10 DIAGNOSIS — J069 Acute upper respiratory infection, unspecified: Secondary | ICD-10-CM | POA: Diagnosis not present

## 2015-09-10 MED ORDER — GUAIFENESIN ER 600 MG PO TB12: 600.0000 mg | ORAL_TABLET | Freq: Two times a day (BID) | ORAL | Status: AC

## 2015-09-10 MED ORDER — POLYETHYLENE GLYCOL 3350 17 GM/SCOOP PO POWD: 17.0000 g | Freq: Every day | ORAL | Status: DC

## 2015-09-10 NOTE — Progress Notes (Signed)
Internal Medicine Clinic Attending  Case discussed with Dr. Truong at the time of the visit.  We reviewed the resident's history and exam and pertinent patient test results.  I agree with the assessment, diagnosis, and plan of care documented in the resident's note.  

## 2015-09-10 NOTE — Assessment & Plan Note (Signed)
Refilled miralax 

## 2015-09-10 NOTE — Patient Instructions (Signed)
You can take Mucinex twice a day as needed for your congestion. If your cough does not improve in 7 days make an appointment with the clinic.   Dextromethorphan; Guaifenesin capsules and ER tablets What is this medicine? DEXTROMETHORPHAN; GUAIFENESIN (dex troe meth OR fan; gwye FEN e sin) is a cough suppressant, expectorant combination. It is used to provide relief from cough. This medicine will not treat an infection. This medicine may be used for other purposes; ask your health care provider or pharmacist if you have questions. COMMON BRAND NAME(S): Alka-Seltzer Plus Max Cough, Mucus & Congestion, Alka-Seltzer Plus Mucus and Congestion, AllFen DM, Ambi, Amibid DM, Aquabid DM, Aquatab DM, Bidex-A, Bidex-DM, Cofex-DM, Coricidin HBP Chest Congetion and Cough, Duradex, Duradex Forte, Extuss LA, Fenesin DM, G-Bid DM TR, GFN 600/DM 30, Guaidrine DM, Guaifenex DM, Guia-D, Humibid DM, Iobid DM, Mindal DM, Mucinex DM, Muco-Fen DM, Phlemex, Q-Bid DM, Relacon LAX, Respa-DM, Ru-Tuss-DM, Sudal DM, Touro DM, Tussi-Bid, Z-Cof LA, Z-Cof LAX What should I tell my health care provider before I take this medicine? They need to know if you have any of these conditions: -asthma -chronic bronchitis -emphysema -if you have taken an MAOI like Carbex, Eldepryl, Marplan, Nardil, or Parnate in last 14 days -kidney or liver disease -unable to sit up -an unusual or allergic reaction to dextromethorphan, guaifenesin, other medicines, foods, dyes, bromides, or preservatives -pregnant or trying to get pregnant -breast-feeding How should I use this medicine? Take this medicine by mouth with a full glass of water. Follow the directions on the prescription label. Do not crush or chew. Take your medicine at regular intervals. Do not take your medicine more often than directed. Talk to your pediatrician regarding the use of this medicine in children. While this drug may be prescribed for children as young as 51 years of age for  selected conditions, precautions do apply. Overdosage: If you think you have taken too much of this medicine contact a poison control center or emergency room at once. NOTE: This medicine is only for you. Do not share this medicine with others. What if I miss a dose? If you miss a dose, take it as soon as you can. If it is almost time for your next dose, take only that dose. Do not take double or extra doses. What may interact with this medicine? Do not take this medicine with any of the following medications: -MAOIs like Carbex, Eldepryl, Marplan, Nardil, and Parnate -procarbazine This medicine may also interact with the following medications: -medicines for depression or other mental disturbances -other medicines for colds or allergy This list may not describe all possible interactions. Give your health care provider a list of all the medicines, herbs, non-prescription drugs, or dietary supplements you use. Also tell them if you smoke, drink alcohol, or use illegal drugs. Some items may interact with your medicine. What should I watch for while using this medicine? Tell your doctor if your symptoms do not improve within 5 days or if they get worse. If you have a high fever, skin rash, lasting headache, or sore throat, see your doctor. Drink 6 to 8 glasses of water daily while you are taking this medicine to help loosen mucus. You may get drowsy or dizzy. Do not drive, use machinery, or do anything that needs mental alertness until you know how this medicine affects you. Do not stand or sit up quickly, especially if you are an older patient. This reduces the risk of dizzy or fainting spells. Alcohol may interfere  with the effect of this medicine. Avoid alcoholic drinks. What side effects may I notice from receiving this medicine? Side effects that you should report to your doctor or health care professional as soon as possible: -allergic reactions like skin rash, itching or hives, swelling of the  face, lips, or tongue -confusion -excitement, nervousness, restlessness, or irritability -slow or troubled breathing Side effects that usually do not require medical attention (report to your doctor or health care professional if they continue or are bothersome): -headache -stomach upset This list may not describe all possible side effects. Call your doctor for medical advice about side effects. You may report side effects to FDA at 1-800-FDA-1088. Where should I keep my medicine? Keep out of the reach of children. Store at room temperature between 15 and 30 degrees C (59 and 86 degrees F). Protect from light and moisture. Throw away any unused medicine after the expiration date. NOTE: This sheet is a summary. It may not cover all possible information. If you have questions about this medicine, talk to your doctor, pharmacist, or health care provider.  2015, Elsevier/Gold Standard. (2008-04-05 17:31:08)

## 2015-09-12 NOTE — Progress Notes (Signed)
   Subjective:    Patient ID: Shelly Campbell, female    DOB: 12/06/1941, 74 y.o.   MRN: [id number]  HPI Pt is a 74 y/o F w/ PMHx of OCD, HLD, and chornic constipation who presents to clinic for a URI x 1 week. Please see problem list for further details.      Review of Systems  Constitutional: Negative for fever and chills.  HENT: Positive for congestion and voice change (hoarsness). Negative for rhinorrhea and sore throat.   Eyes: Negative for pain.  Respiratory: Positive for choking. Negative for shortness of breath.   Cardiovascular: Negative for chest pain.  Gastrointestinal: Positive for constipation. Negative for nausea, vomiting and diarrhea.  Musculoskeletal: Negative for myalgias.  Neurological: Negative for weakness.       Objective:   Physical Exam  Constitutional: She appears well-developed and well-nourished. No distress.  Neck: Neck supple.  Cardiovascular: Normal rate and regular rhythm.   No murmur heard. Pulmonary/Chest: Effort normal and breath sounds normal. No respiratory distress. She has no wheezes. She has no rales.  Abdominal: Soft. Bowel sounds are normal. She exhibits no distension. There is no tenderness.  Lymphadenopathy:    She has no cervical adenopathy.  Neurological: She is alert.  Skin: Skin is warm and dry.          Assessment & Plan:

## 2015-09-12 NOTE — Assessment & Plan Note (Addendum)
Pt complaining of coughing and congestion x 1 week. She states it has improved however she concerned she has a bronchitis or PNA since she received a pneumonia vaccine her last clinic visit. She has never smoked before. Denies fevers, SOB, malaise, and sore throat. She lives alone and denies any sick contacts.  She is coughing up mainly white mucous that will occasionally be yellow. On exam she is CTAB, neg for lymphadenopathy. She has a post nasal drip visible. Likely pt has a viral URI that is now improving. Advise pt to take mucinex for congestion. Instructed to make an appointment in 1 week if she does not feel any better.

## 2015-10-20 DIAGNOSIS — Z23 Encounter for immunization: Secondary | ICD-10-CM | POA: Diagnosis not present

## 2016-06-22 ENCOUNTER — Telehealth: Payer: Self-pay | Admitting: Internal Medicine

## 2016-06-24 NOTE — Telephone Encounter (Signed)
Needs her yrly appt with her new PCP (Doris to reassign) in July, Aug, or Sept.

## 2016-06-25 NOTE — Telephone Encounter (Signed)
Patient is scheduled for 07-14-16 @ 1:30 pm with Dr. Danford Bad.  Sending appt in the mail and msg via my chart.

## 2016-07-14 ENCOUNTER — Ambulatory Visit (INDEPENDENT_AMBULATORY_CARE_PROVIDER_SITE_OTHER): Payer: Medicare Other | Admitting: Internal Medicine

## 2016-07-14 ENCOUNTER — Ambulatory Visit (HOSPITAL_COMMUNITY)
Admission: RE | Admit: 2016-07-14 | Discharge: 2016-07-14 | Disposition: A | Discharge status: Home / Self Care | Payer: Medicare Other | Source: Ambulatory Visit | Attending: Dental General Practice | Admitting: Dental General Practice

## 2016-07-14 ENCOUNTER — Ambulatory Visit (HOSPITAL_COMMUNITY)
Admission: RE | Admit: 2016-07-14 | Discharge: 2016-07-14 | Disposition: A | Discharge status: Home / Self Care | Payer: Medicare Other | Source: Ambulatory Visit | Attending: Internal Medicine | Admitting: Internal Medicine

## 2016-07-14 ENCOUNTER — Encounter: Payer: Self-pay | Admitting: Internal Medicine

## 2016-07-14 VITALS — BP 122/73 | HR 73 | Temp 97.7°F | Ht 65.0 in | Wt 137.6 lb

## 2016-07-14 DIAGNOSIS — M545 Low back pain, unspecified: Secondary | ICD-10-CM | POA: Insufficient documentation

## 2016-07-14 DIAGNOSIS — I700 Atherosclerosis of aorta: Secondary | ICD-10-CM | POA: Insufficient documentation

## 2016-07-14 DIAGNOSIS — M810 Age-related osteoporosis without current pathological fracture: Secondary | ICD-10-CM

## 2016-07-14 DIAGNOSIS — F429 Obsessive-compulsive disorder, unspecified: Secondary | ICD-10-CM | POA: Diagnosis not present

## 2016-07-14 DIAGNOSIS — K5909 Other constipation: Secondary | ICD-10-CM

## 2016-07-14 DIAGNOSIS — E785 Hyperlipidemia, unspecified: Secondary | ICD-10-CM | POA: Diagnosis not present

## 2016-07-14 DIAGNOSIS — M7989 Other specified soft tissue disorders: Secondary | ICD-10-CM | POA: Diagnosis not present

## 2016-07-14 DIAGNOSIS — K5900 Constipation, unspecified: Secondary | ICD-10-CM | POA: Diagnosis not present

## 2016-07-14 DIAGNOSIS — G3184 Mild cognitive impairment, so stated: Secondary | ICD-10-CM | POA: Diagnosis not present

## 2016-07-14 DIAGNOSIS — E559 Vitamin D deficiency, unspecified: Secondary | ICD-10-CM

## 2016-07-14 DIAGNOSIS — M5146 Schmorl's nodes, lumbar region: Secondary | ICD-10-CM | POA: Insufficient documentation

## 2016-07-14 DIAGNOSIS — M47816 Spondylosis without myelopathy or radiculopathy, lumbar region: Secondary | ICD-10-CM | POA: Diagnosis not present

## 2016-07-14 DIAGNOSIS — Z0000 Encounter for general adult medical examination without abnormal findings: Secondary | ICD-10-CM | POA: Diagnosis not present

## 2016-07-14 MED ORDER — POLYETHYLENE GLYCOL 3350 17 GM/SCOOP PO POWD: 17.0000 g | Freq: Every day | ORAL | Status: DC

## 2016-07-14 NOTE — Assessment & Plan Note (Signed)
Lumbar x ray ordered to rule out compression fracture.

## 2016-07-14 NOTE — Assessment & Plan Note (Signed)
Patient reports symptoms well controlled on current medical therapy. She reports her obsessions/compulsions of checking the stove burners have decreased.

## 2016-07-14 NOTE — Patient Instructions (Addendum)
It was an absolute pleasure meeting you today and I look forward to our next visit! 1. Today we talked about your cognition. I have ordered several labs to check for reversible causes and I will contact you with any abnormal results. I would like to see you back in 6 months to see how you are doing. 2. Today we also talked about your low back pain and osteoporosis. I have ordered a DEXA (bone) scan to evaluate this as well as a lumbar x-ray. I will call you if there are any abnormalities. We also discussed the possibility of adding a new medication if the DEXA scan still shows osteoporosis.  3. Today we also talked about your left leg swelling. I've ordered several labs to check for reversible causes for this. Please elevate your legs and try compression hose.   Osteoporosis Osteoporosis is the thinning and loss of density in the bones. Osteoporosis makes the bones more brittle, fragile, and likely to break (fracture). Over time, osteoporosis can cause the bones to become so weak that they fracture after a simple fall. The bones most likely to fracture are the bones in the hip, wrist, and spine. CAUSES  The exact cause is not known. RISK FACTORS Anyone can develop osteoporosis. You may be at greater risk if you have a family history of the condition or have poor nutrition. You may also have a higher risk if you are:   Female.   81 years old or older.  A smoker.  Not physically active.   White or Asian.  Slender. SIGNS AND SYMPTOMS  A fracture might be the first sign of the disease, especially if it results from a fall or injury that would not usually cause a bone to break. Other signs and symptoms include:   Low back and neck pain.  Stooped posture.  Height loss. DIAGNOSIS  To make a diagnosis, your health care provider may:  Take a medical history.  Perform a physical exam.  Order tests, such as:  A bone mineral density test.  A dual-energy X-ray absorptiometry  test. TREATMENT  The goal of osteoporosis treatment is to strengthen your bones to reduce your risk of a fracture. Treatment may involve:  Making lifestyle changes, such as:  Eating a diet rich in calcium.  Doing weight-bearing and muscle-strengthening exercises.  Stopping tobacco use.  Limiting alcohol intake.  Taking medicine to slow the process of bone loss or to increase bone density.  Monitoring your levels of calcium and vitamin D. HOME CARE INSTRUCTIONS  Include calcium and vitamin D in your diet. Calcium is important for bone health, and vitamin D helps the body absorb calcium.  Perform weight-bearing and muscle-strengthening exercises as directed by your health care provider.  Do not use any tobacco products, including cigarettes, chewing tobacco, and electronic cigarettes. If you need help quitting, ask your health care provider.  Limit your alcohol intake.  Take medicines only as directed by your health care provider.  Keep all follow-up visits as directed by your health care provider. This is important.  Take precautions at home to lower your risk of falling, such as:  Keeping rooms well lit and clutter free.  Installing safety rails on stairs.  Using rubber mats in the bathroom and other areas that are often wet or slippery.   SEEK IMMEDIATE MEDICAL CARE IF:  You fall or injure yourself.    This information is not intended to replace advice given to you by your health care provider. Make  sure you discuss any questions you have with your health care provider.   Document Released: 09/23/2005 Document Revised: 01/04/2015 Document Reviewed: 05/24/2014 Elsevier Interactive Patient Education [employer].

## 2016-07-14 NOTE — Assessment & Plan Note (Signed)
Patients constipation improved with use of daily Miralax. Refill ordered. Last colonoscopy 2007 and was normal. Denied any blood in stool, change in stool or weight loss.

## 2016-07-14 NOTE — Progress Notes (Signed)
CC: memory problems, low back pain and left leg swelling  HPI:  [name] is a very pleasant 75 y.o. female who presents today for her annual check up. The patient reports she is doing well but is concerned about her memory, low back pain and left leg swelling. The patient reports a gradual decline in her memory over the last several months. She reports it takes longer to think and she forgets simple things like her address or what she is saying. She does not drive and lives alone. She reports her daughter lives 3 miles away and is very involved in her care. 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  She also complains of low back pain that's been present for the past several months. It is worse with activity and at the worst is rated 8/10. She denies any trauma or falls. Denies any numbness or tingling down legs.  She also complains of left lower extremity swelling that's worsened over the past month. It's worse at the end of the day and resolves overnight when she rests. She denies any pain, tenderness, redness or any other symptoms. She does report a history of injury to that leg many years ago.  Past Medical History  Diagnosis Date  . Blood in stool     normal colonoscopy 2007  . Chronic fatigue syndrome   . OCD (obsessive compulsive disorder)   . Allergic rhinitis   . Osteoporosis   . Vitamin D deficiency   . Migraine   . Anxiety   . Multiple chemical sensitivity syndrome   . Osteoporosis   . Basal cell carcinoma     left temple  . Hyperlipidemia 08/26/2014    Review of Systems:   Review of Systems  Constitutional: Negative for fever, chills, weight loss and malaise/fatigue.  Eyes: Negative for blurred vision.  Respiratory: Negative for cough, shortness of breath and wheezing.   Cardiovascular: Positive for leg swelling. Negative for chest pain, palpitations and orthopnea.  Gastrointestinal: Positive for constipation.  Negative for heartburn, nausea, vomiting and abdominal pain.  Genitourinary: Negative for dysuria, urgency and frequency.  Musculoskeletal: Positive for back pain. Negative for falls.  Neurological: Positive for headaches. Negative for tremors, speech change, focal weakness and weakness.  Psychiatric/Behavioral: Positive for memory loss. Negative for depression.     Physical Exam:  Filed Vitals:   07/14/16 1352  BP: 122/73  Pulse: 73  Temp: 97.7 F (36.5 C)  TempSrc: Oral  Height: 5\' 5"  (1.651 m)  Weight: 137 lb 9.6 oz (62.415 kg)  SpO2: 99%   Physical Exam  Constitutional: She is oriented to person, place, and time and well-developed, well-nourished, and in no distress.  HENT:  Head: Normocephalic and atraumatic.  Eyes: Pupils are equal, round, and reactive to light.  Cardiovascular: Normal rate and regular rhythm.   Pulmonary/Chest: Effort normal and breath sounds normal.  Abdominal: Soft. Bowel sounds are normal. There is no tenderness.  Neurological: She is alert and oriented to person, place, and time.  Skin: Skin is warm and dry.  Psychiatric: Affect normal.  Some impaired short term and long term memory     Montreal Cognitive Assessment  07/14/2016  Visuospatial/ Executive (0/5) 1  Naming (0/3) 3  Attention: Read list of digits (0/2) 2  Attention: Read list of letters (0/1) 1  Attention: Serial 7 subtraction starting at 100 (0/3) 0  Language: Repeat phrase (0/2) 2  Language :  Fluency (0/1) 1  Abstraction (0/2) 2  Delayed Recall (0/5) 3  Orientation (0/6) 6  Total 21  Adjusted Score (based on education) 21    Assessment & Plan:   See encounters tab for problem based medical decision making.   Patient seen with Dr. Angelia Mould

## 2016-07-14 NOTE — Assessment & Plan Note (Signed)
Patient showed evidence of osteoporosis on DEXA scan from 2014 and was started on vitamin D and calcium. She reports compliance with these medications. She denied being started on any bisphosphonate therapy. Will repeat DEXA scan and start on bisphosphonates if osteoporosis persists.

## 2016-07-15 LAB — CBC
HEMATOCRIT: 37.4 % (ref 34.0–46.6)
HEMOGLOBIN: 12.5 g/dL (ref 11.1–15.9)
MCH: 31.2 pg (ref 26.6–33.0)
MCHC: 33.4 g/dL (ref 31.5–35.7)
MCV: 93 fL (ref 79–97)
Platelets: 198 10*3/uL (ref 150–379)
RBC: 4.01 x10E6/uL (ref 3.77–5.28)
RDW: 13.2 % (ref 12.3–15.4)
WBC: 4.6 10*3/uL (ref 3.4–10.8)

## 2016-07-15 LAB — LIPID PANEL
CHOL/HDL RATIO: 2.5 ratio (ref 0.0–4.4)
CHOLESTEROL TOTAL: 183 mg/dL (ref 100–199)
HDL: 72 mg/dL (ref >39)
LDL CALC: 82 mg/dL (ref 0–99)
TRIGLYCERIDES: 146 mg/dL (ref 0–149)
VLDL CHOLESTEROL CAL: 29 mg/dL (ref 5–40)

## 2016-07-15 LAB — TSH: TSH: 1.5 u[IU]/mL (ref 0.450–4.500)

## 2016-07-16 DIAGNOSIS — H40053 Ocular hypertension, bilateral: Secondary | ICD-10-CM | POA: Diagnosis not present

## 2016-07-16 DIAGNOSIS — H25013 Cortical age-related cataract, bilateral: Secondary | ICD-10-CM | POA: Diagnosis not present

## 2016-07-16 DIAGNOSIS — H40013 Open angle with borderline findings, low risk, bilateral: Secondary | ICD-10-CM | POA: Diagnosis not present

## 2016-07-17 DIAGNOSIS — G3184 Mild cognitive impairment, so stated: Secondary | ICD-10-CM | POA: Insufficient documentation

## 2016-07-17 NOTE — Assessment & Plan Note (Signed)
MOCA today is 21, ordered screening labs, will follow up in 6 months for repeat assessement.  Wants to defer MRI brain till that time.

## 2016-07-17 NOTE — Addendum Note (Signed)
Addended by: Joni Reining C on: 07/17/2016 11:56 AM   Modules accepted: Level of Service

## 2016-07-17 NOTE — Progress Notes (Signed)
Internal Medicine Clinic Attending  I saw and evaluated the patient.  I personally confirmed the key portions of the history and exam documented by Dr. Danford Bad and I reviewed pertinent patient test results.  The assessment, diagnosis, and plan were formulated together and I agree with the documentation in the resident's note. MOCA is significant for MCI, vitamin B12 was normal in 2014, no change in diet.  Repeat TSH today.  Will have back for repeat assessment in 6 months and reconsider ordering MRI at that time. We will repeat or DEXA and obtain lumbar Xrays to rule out compression Fx from osteoporosis,  She is a good candidate for Fosamax and we will likely start this pending the results.

## 2016-07-17 NOTE — Assessment & Plan Note (Signed)
Check lipid panel  

## 2016-08-22 ENCOUNTER — Other Ambulatory Visit: Payer: Self-pay | Admitting: Internal Medicine

## 2016-08-24 NOTE — Telephone Encounter (Signed)
Dec, Jan, Feb appt with Dr Danford Bad. OCD F/U

## 2016-09-03 ENCOUNTER — Other Ambulatory Visit: Payer: Self-pay | Admitting: Internal Medicine

## 2016-09-15 ENCOUNTER — Other Ambulatory Visit: Payer: Self-pay | Admitting: Internal Medicine

## 2016-10-15 DIAGNOSIS — Z23 Encounter for immunization: Secondary | ICD-10-CM | POA: Diagnosis not present

## 2016-11-04 ENCOUNTER — Other Ambulatory Visit: Payer: Self-pay | Admitting: Internal Medicine

## 2017-05-12 ENCOUNTER — Other Ambulatory Visit: Payer: Self-pay | Admitting: *Deleted

## 2017-05-20 ENCOUNTER — Other Ambulatory Visit: Payer: Self-pay | Admitting: Internal Medicine

## 2017-05-20 NOTE — Telephone Encounter (Signed)
Informed pt - Imitrex rx was sent to Dr Danford Bad this morning; give her at least 48 hrs to refill med.Stated the pharmacy had sent request 1 week ago - I did not see it in EPIC.

## 2017-05-20 NOTE — Telephone Encounter (Signed)
Patient needs medication refill.

## 2017-07-20 ENCOUNTER — Encounter: Payer: Self-pay | Admitting: Internal Medicine

## 2017-07-20 ENCOUNTER — Ambulatory Visit (INDEPENDENT_AMBULATORY_CARE_PROVIDER_SITE_OTHER): Payer: Medicare Other | Admitting: Internal Medicine

## 2017-07-20 VITALS — BP 132/61 | HR 64 | Temp 97.6°F | Ht 65.0 in | Wt 137.1 lb

## 2017-07-20 DIAGNOSIS — G43009 Migraine without aura, not intractable, without status migrainosus: Secondary | ICD-10-CM

## 2017-07-20 DIAGNOSIS — N182 Chronic kidney disease, stage 2 (mild): Secondary | ICD-10-CM

## 2017-07-20 DIAGNOSIS — E785 Hyperlipidemia, unspecified: Secondary | ICD-10-CM | POA: Diagnosis not present

## 2017-07-20 DIAGNOSIS — Z85828 Personal history of other malignant neoplasm of skin: Secondary | ICD-10-CM | POA: Diagnosis not present

## 2017-07-20 DIAGNOSIS — E559 Vitamin D deficiency, unspecified: Secondary | ICD-10-CM

## 2017-07-20 DIAGNOSIS — R5383 Other fatigue: Secondary | ICD-10-CM

## 2017-07-20 DIAGNOSIS — M810 Age-related osteoporosis without current pathological fracture: Secondary | ICD-10-CM | POA: Diagnosis not present

## 2017-07-20 DIAGNOSIS — G3184 Mild cognitive impairment, so stated: Secondary | ICD-10-CM | POA: Diagnosis not present

## 2017-07-20 IMAGING — CR DG LUMBAR SPINE COMPLETE 4+V
5 series · 5 of 5 positions shown · non-contrast
Comparison: None.

CLINICAL DATA: 74-year-old female with lower back pain bilaterally
for 5 years. No specific injury. Initial encounter.

EXAM:
LUMBAR SPINE - COMPLETE 4+ VIEW

[l-spine ap]
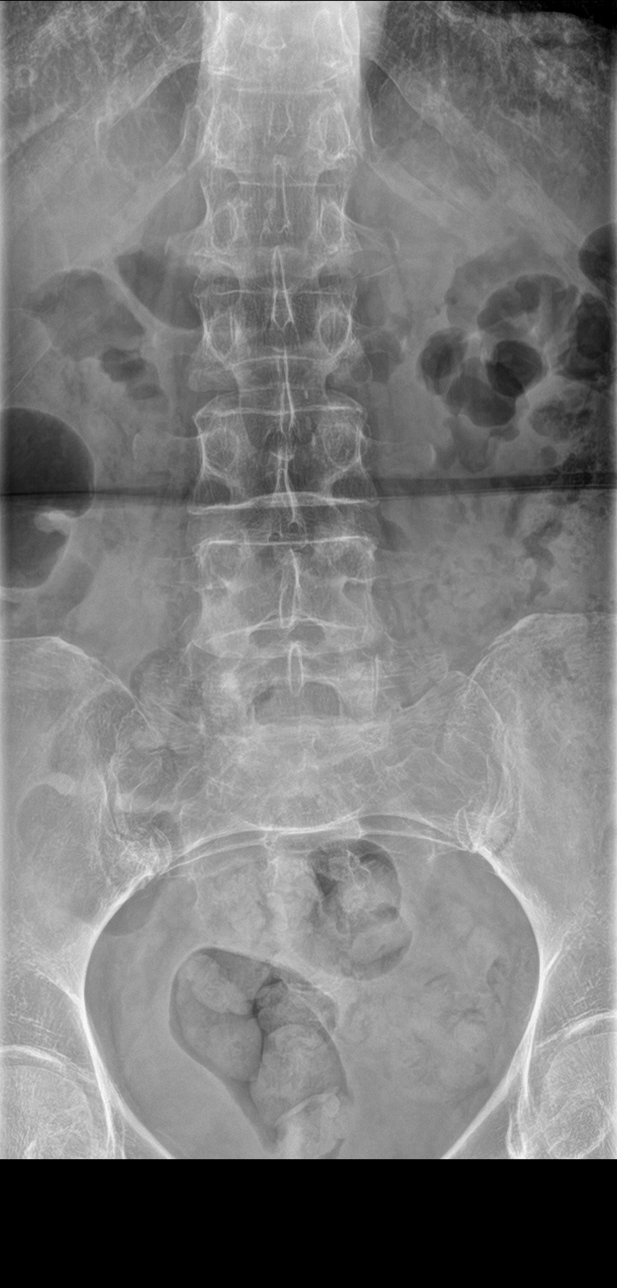

[l-spine obl (1 of 2)]
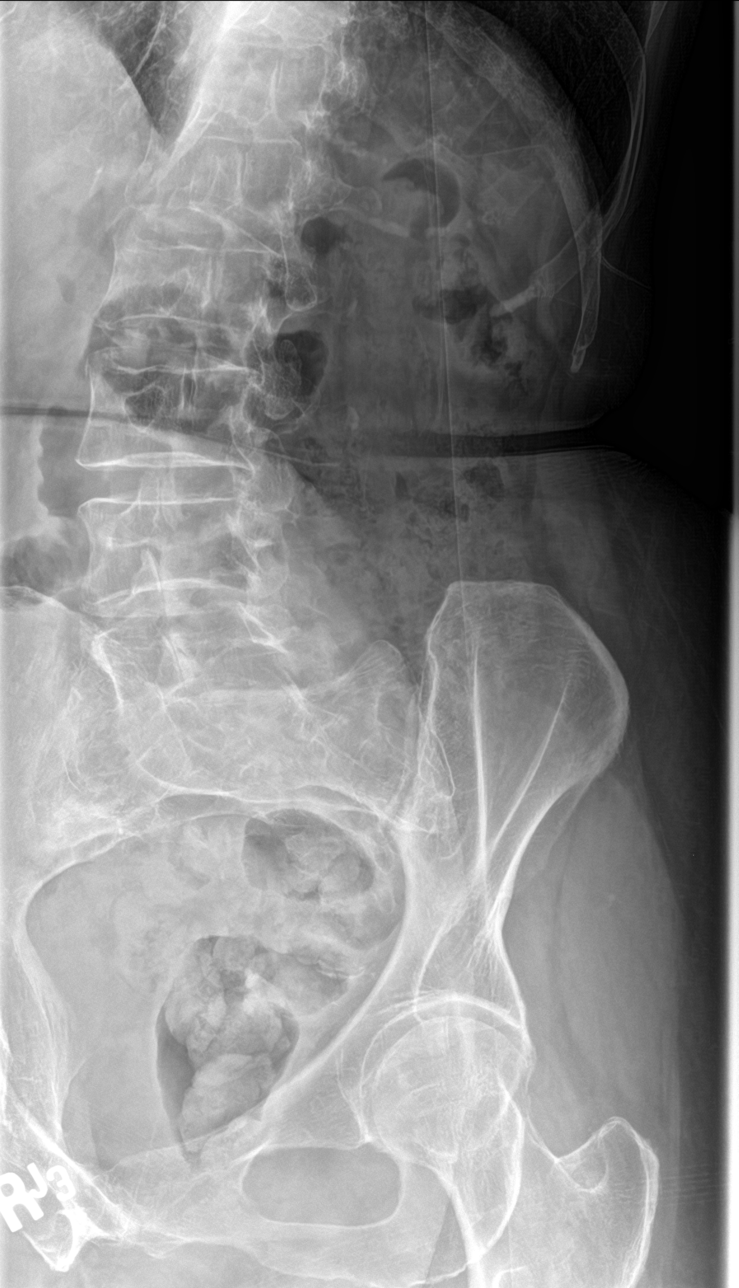

[l-spine obl (2 of 2)]
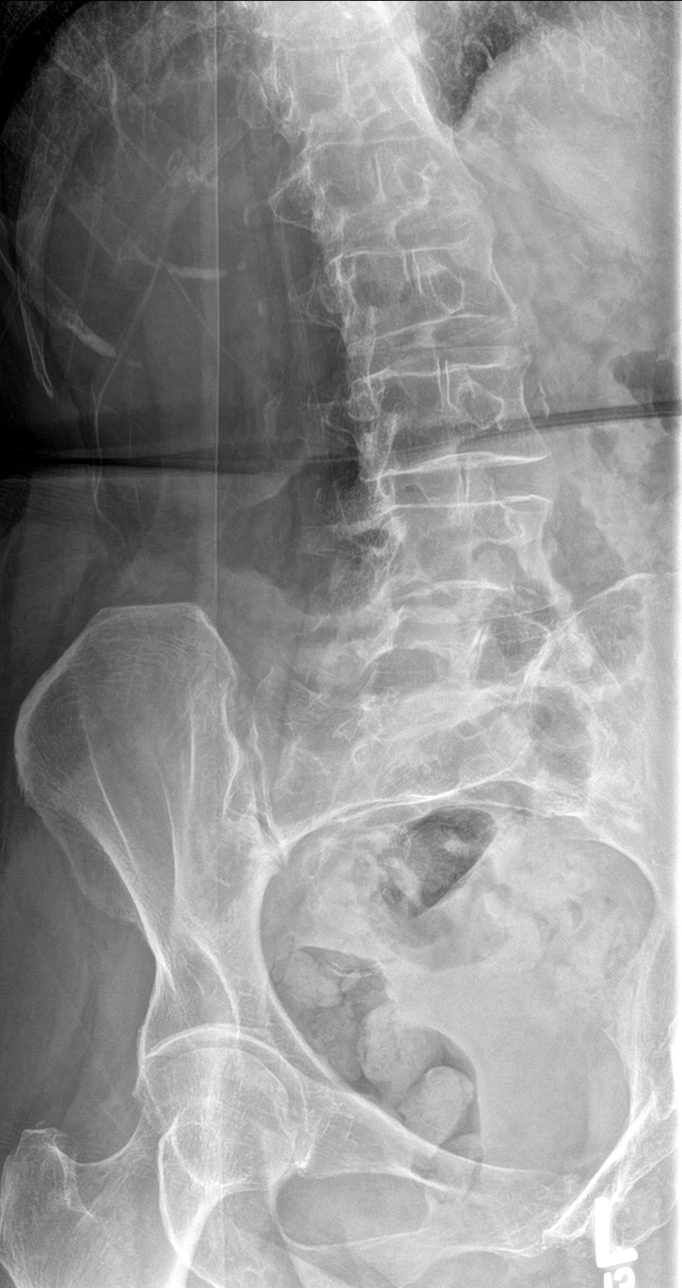

[l-spine lat]
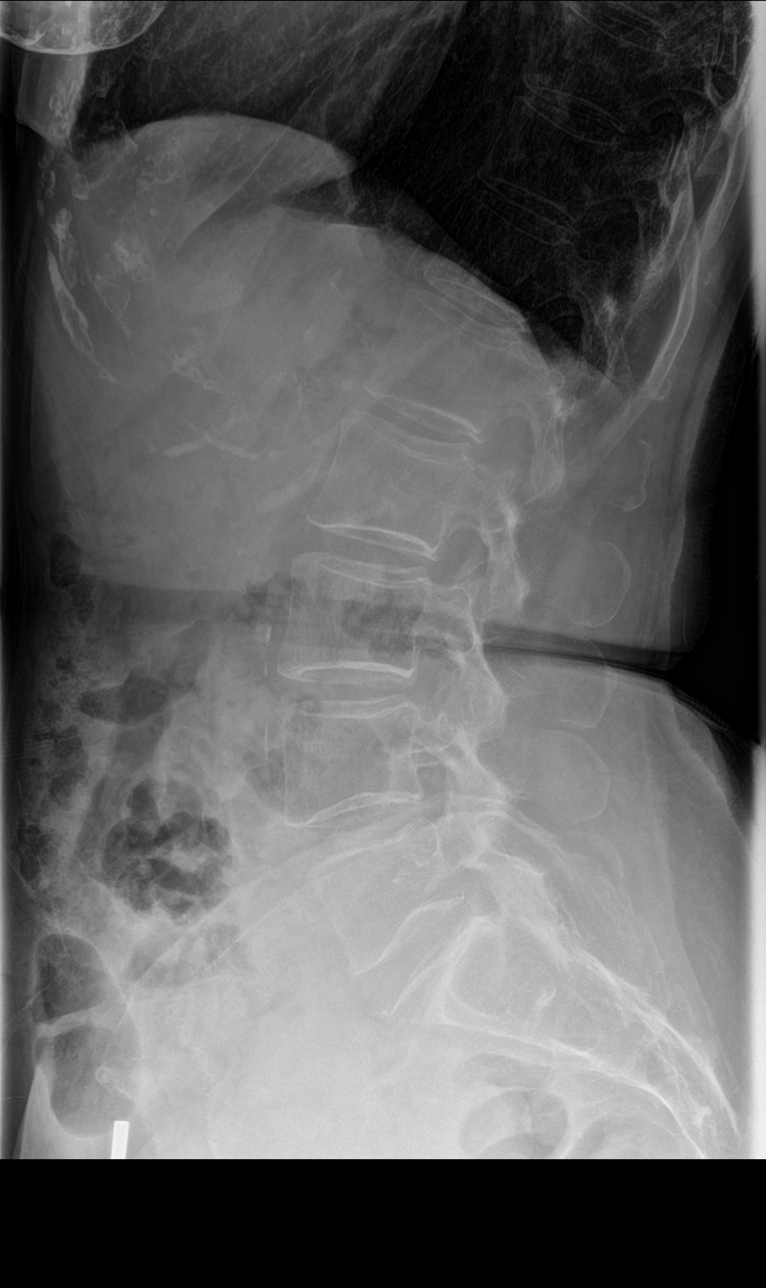

[l-spine spot]
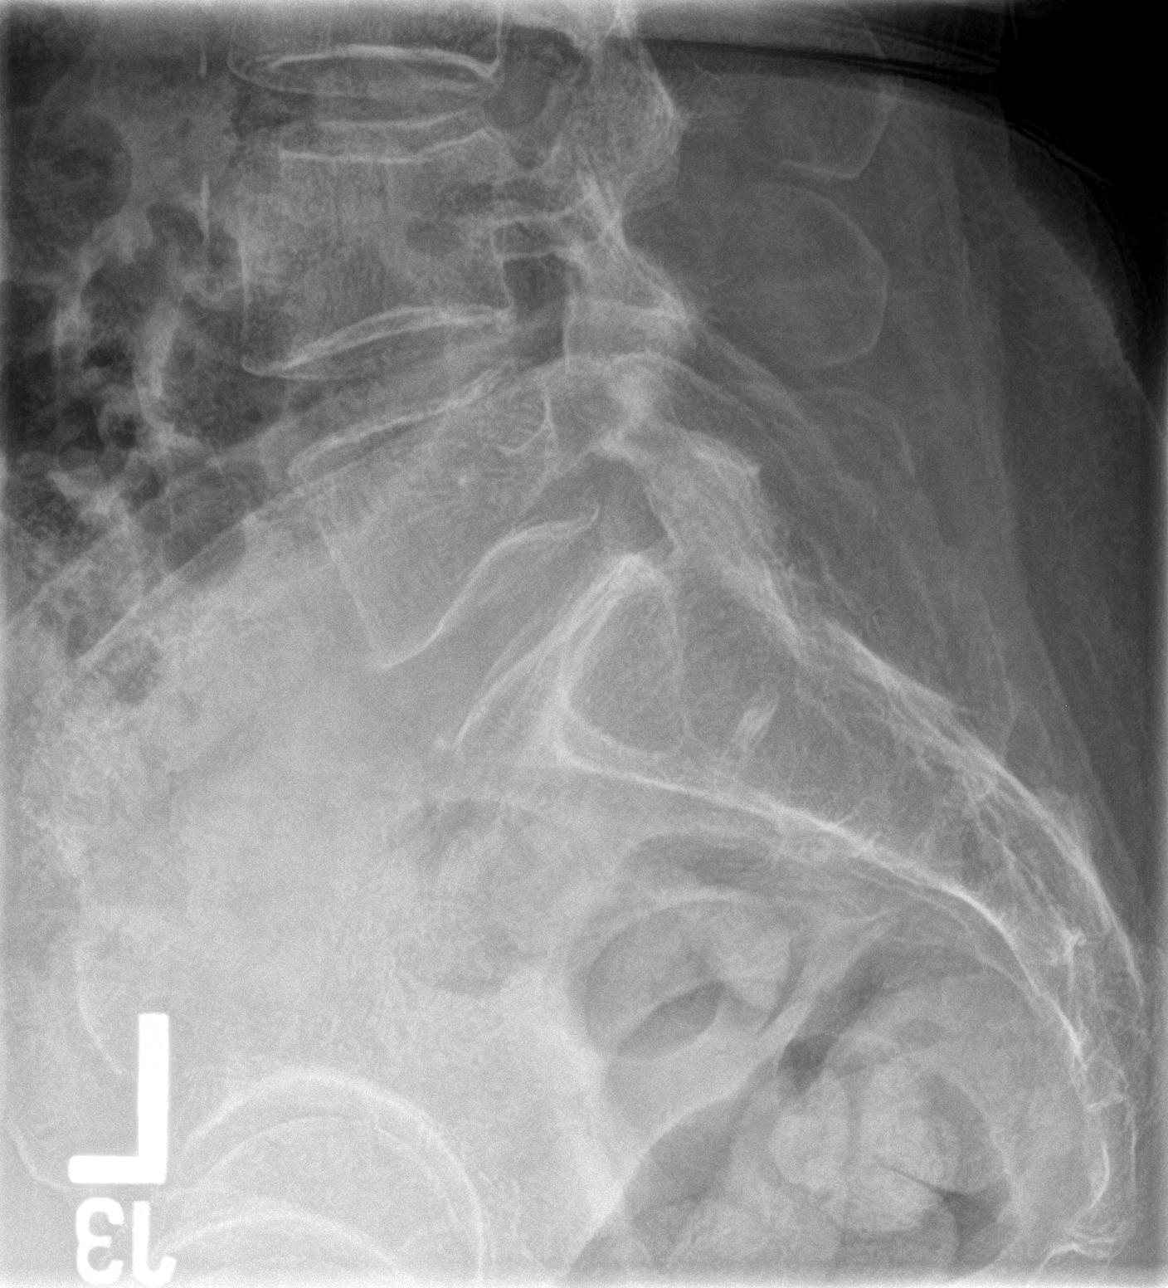

[5 of 5 positions shown; findings below may reference images not displayed]

FINDINGS: Minimal curvature lumbar spine convex the right.

Minimal Schmorl's node deformity inferior endplate L5.

No significant disc space narrowing.

Minimal facet degenerative changes L4-5 and L5-S1.

Bones may be osteopenic.

Aortic calcifications.
IMPRESSION: Minimal Schmorl's node deformity inferior endplate L5.

No significant disc space narrowing.

Minimal facet degenerative changes L4-5 and L5-S1.

Bones may be osteopenic.

Aortic atherosclerosis.

## 2017-07-20 MED ORDER — SUMATRIPTAN SUCCINATE 100 MG PO TABS: 100.0000 mg | ORAL_TABLET | ORAL | 3 refills | Status: DC | PRN

## 2017-07-20 MED ORDER — FLUVOXAMINE MALEATE 100 MG PO TABS: 100.0000 mg | ORAL_TABLET | Freq: Two times a day (BID) | ORAL | 3 refills | Status: DC

## 2017-07-20 NOTE — Assessment & Plan Note (Signed)
History of vitamin D deficiency and wants to know if she remains deficient. She takes 2000 units D3 daily. Does complain of some fatigue.  -Vitamin D level -Continue 2000 units daily, adjust if deficient

## 2017-07-20 NOTE — Assessment & Plan Note (Signed)
Hx of BCC removed 10+ yrs ago. Notes recurrent pink growth over the past several months. No pain, discharge, itching however worried about recurrence and requests referral to dermatology for evaluation. 8x9mm pink papule on left temple.  -Refer to dermatology

## 2017-07-20 NOTE — Patient Instructions (Addendum)
It was an absolute pleasure seeing you today!!!   Im so glad you are doing well. Today we talked about your skin lesion. I will refer you to a dermatologist for a second opinion.  I have also sent in refills of your medications to your [place]. I have tried to provide 3 month supplies of all of your medications.   Today we will obtain routine labs as well as a TSH (thyroid test) to ensure things are going okay.  Please come back and see me anytime, otherwise, I will see you again in 1 year!!!     Good luck with school, Shelly Campbell!!! You will do GREAT! Always keep your head up and focus on your long-term goals, that's what got me through those tough nights!

## 2017-07-20 NOTE — Assessment & Plan Note (Addendum)
As evidenced on DEXA from 2014. Has been on Vitamin D and Calcium since that time. Discussed repeat DEXA +/- bisphosphonate therapy at her last visit with me however not interested. She wishes to continue Calcium and Vitamin D supplementation. Counseled about risks of osteoporotic fractures. -Continue Calcium and Vitamin D -Consider repeat DEXA, +/- bisphosphonate if indicated

## 2017-07-20 NOTE — Assessment & Plan Note (Signed)
Generalized non-specific fatigue.  -TSH -Vitamin D -Recommended B12 suppl.

## 2017-07-20 NOTE — Assessment & Plan Note (Signed)
Most recent GFR 87 in 2016, Cr at that time 0.69. She denies any changes in frequency, color and denies any dysuria. Is interested in repeating her renal function today.  -CMET

## 2017-07-20 NOTE — Assessment & Plan Note (Addendum)
MOCA 26 today, was 21 1 year ago at which time screening labs wnl. She was to follow up in 6 months however deferred. She declined MRI at that time. She feels her memory has improved with vitamins/supplements and no longer wishes to pursue work-up.  -believe 2/2 normal cognitive decline -Monitor, but no further work-up indicated right now

## 2017-07-20 NOTE — Assessment & Plan Note (Signed)
Hx of HLD not currently on statin. Will check lipid panel today.

## 2017-07-20 NOTE — Progress Notes (Signed)
   CC: follow-up of MCI, osteoporosis, OCD  HPI:  Shelly Campbell is a very pleasant 76 y.o. F with medial history as outlined below who presents for follow-up of her chronic medical conditions. She asks about CBC, CMET, Lipid panel and TSH as well as dermatology referral as she notices skin changes at site of prior [facility].  Mild cognitive Impairment: At her visit 1 year ago she complained of a gradual decline in memory, noting forgetting "simple things" like her address, what she is saying or what she entered a room for. MOCA 21 at that time and TSH wnl. States memory has stayed same/even improved at last visit and is able to complete all ADLs/IADLs. MOCA today 26.  Osteoporosis: Documented on prior DEXA 2014. On Ca + Vitamin D. Recent Lumbar spine x-ray was neg for compression fracture. Not interested in Bisphosphonate's currently. No hx of fxr.  OCD: Checks stove burners obsessively. Much improved with Fluvox.   Hx of basal cell carcinoma: Had Hosford removed from left temple 10+ years ago. Notes return of pale pink growth at same site. No pain, itching, discharge.  Past Medical History:  Diagnosis Date  . Allergic rhinitis   . Anxiety   . Basal cell carcinoma    left temple  . Blood in stool    normal colonoscopy 2007  . Chronic fatigue syndrome   . Hyperlipidemia 08/26/2014  . Migraine   . Multiple chemical sensitivity syndrome   . OCD (obsessive compulsive disorder)   . Osteoporosis   . Osteoporosis   . Vitamin D deficiency    Review of Systems:   General: +Fatigue. Denies fevers, chills, weight loss HEENT: Denies changes in vision, sore throat, dysphagia Cardiac: +Occasional mild DOE, Denies CP, palpitations Pulmonary: Denies cough, wheezes, PND Abd: Denies diarrhea, constipation, changes in bowels Extremities: Denies weakness or swelling  Physical Exam: General: Alert, in no acute distress. Pleasant and conversant HEENT: No icterus, injection or ptosis. No hoarseness or  dysarthria Left temple with pink pearly papule, approx 14mm x3 mm. Minimal scaling. Not tender. No drainage.  Cardiac: RRR, no MGR appreciated Pulmonary: CTA BL with normal WOB on RA. Able to speak in complete sentences Abd: Soft, non-tended. +bs Extremities: Warm, perfused. No significant pedal edema.   Vitals:   07/20/17 1540  BP: 132/61  Pulse: 64  Temp: 97.6 F (36.4 C)  TempSrc: Oral  SpO2: 97%  Weight: 137 lb 1.6 oz (62.2 kg)  Height: 5\' 5"  (1.651 m)   Assessment & Plan:   See Encounters Tab for problem based charting.  Patient discussed with Dr. Daryll Drown

## 2017-07-20 NOTE — Assessment & Plan Note (Addendum)
No changes in HA. Well controlled with occasional abortive Imitrex 100mg . Last required today however uses sparingly. Sites bad weather as current trigger. Requests refill. -CMET to evaluate renal and hepatic function -Refill given of Sumatriptan 189m Q2H prn migraine, max dose 200mg  -Could consider maintenance therapy, like topamax, if HA frequency should increase

## 2017-07-21 LAB — LIPID PANEL
Chol/HDL Ratio: 2.7 ratio (ref 0.0–4.4)
Cholesterol, Total: 192 mg/dL (ref 100–199)
HDL: 71 mg/dL (ref >39)
LDL Calculated: 99 mg/dL (ref 0–99)
Triglycerides: 110 mg/dL (ref 0–149)
VLDL Cholesterol Cal: 22 mg/dL (ref 5–40)

## 2017-07-24 LAB — CBC
Hematocrit: 39.7 % (ref 34.0–46.6)
Hemoglobin: 13.6 g/dL (ref 11.1–15.9)
MCH: 32.1 pg (ref 26.6–33.0)
MCHC: 34.3 g/dL (ref 31.5–35.7)
MCV: 94 fL (ref 79–97)
Platelets: 188 10*3/uL (ref 150–379)
RBC: 4.24 x10E6/uL (ref 3.77–5.28)
RDW: 13.4 % (ref 12.3–15.4)
WBC: 5.8 10*3/uL (ref 3.4–10.8)

## 2017-07-24 LAB — COMPREHENSIVE METABOLIC PANEL
ALT: 21 IU/L (ref 0–32)
AST: 25 IU/L (ref 0–40)
Albumin/Globulin Ratio: 1.6 (ref 1.2–2.2)
Albumin: 4.1 g/dL (ref 3.5–4.8)
Alkaline Phosphatase: 82 IU/L (ref 39–117)
BILIRUBIN TOTAL: 0.2 mg/dL (ref 0.0–1.2)
BUN/Creatinine Ratio: 21 (ref 12–28)
BUN: 15 mg/dL (ref 8–27)
CALCIUM: 9 mg/dL (ref 8.7–10.3)
CHLORIDE: 101 mmol/L (ref 96–106)
CO2: 24 mmol/L (ref 20–29)
Creatinine, Ser: 0.7 mg/dL (ref 0.57–1.00)
GFR calc non Af Amer: 84 mL/min/{1.73_m2} (ref >59)
GFR, EST AFRICAN AMERICAN: 97 mL/min/{1.73_m2} (ref >59)
GLUCOSE: 89 mg/dL (ref 65–99)
Globulin, Total: 2.5 g/dL (ref 1.5–4.5)
Potassium: 4.4 mmol/L (ref 3.5–5.2)
Sodium: 142 mmol/L (ref 134–144)
TOTAL PROTEIN: 6.6 g/dL (ref 6.0–8.5)

## 2017-07-24 LAB — VITAMIN D 1,25 DIHYDROXY
VITAMIN D3 1, 25 (OH): 47 pg/mL
Vitamin D 1, 25 (OH)2 Total: 48 pg/mL

## 2017-07-24 LAB — TSH: TSH: 2.28 u[IU]/mL (ref 0.450–4.500)

## 2017-07-24 NOTE — Progress Notes (Signed)
Internal Medicine Clinic Attending  Case discussed with Dr. Molt at the time of the visit.  We reviewed the resident's history and exam and pertinent patient test results.  I agree with the assessment, diagnosis, and plan of care documented in the resident's note. 

## 2017-07-24 NOTE — Addendum Note (Signed)
Addended by: Gilles Chiquito B on: 07/24/2017 11:26 AM   Modules accepted: Level of Service

## 2017-08-25 DIAGNOSIS — C44519 Basal cell carcinoma of skin of other part of trunk: Secondary | ICD-10-CM | POA: Diagnosis not present

## 2017-08-25 DIAGNOSIS — C44319 Basal cell carcinoma of skin of other parts of face: Secondary | ICD-10-CM | POA: Diagnosis not present

## 2017-08-25 DIAGNOSIS — L821 Other seborrheic keratosis: Secondary | ICD-10-CM | POA: Diagnosis not present

## 2017-09-20 DIAGNOSIS — C44319 Basal cell carcinoma of skin of other parts of face: Secondary | ICD-10-CM | POA: Diagnosis not present

## 2017-09-20 DIAGNOSIS — Z85828 Personal history of other malignant neoplasm of skin: Secondary | ICD-10-CM | POA: Diagnosis not present

## 2017-09-27 DIAGNOSIS — Z85828 Personal history of other malignant neoplasm of skin: Secondary | ICD-10-CM | POA: Diagnosis not present

## 2017-09-27 DIAGNOSIS — C44519 Basal cell carcinoma of skin of other part of trunk: Secondary | ICD-10-CM | POA: Diagnosis not present

## 2017-09-27 DIAGNOSIS — L570 Actinic keratosis: Secondary | ICD-10-CM | POA: Diagnosis not present

## 2017-09-27 DIAGNOSIS — C44319 Basal cell carcinoma of skin of other parts of face: Secondary | ICD-10-CM | POA: Diagnosis not present

## 2017-09-28 ENCOUNTER — Other Ambulatory Visit: Payer: Self-pay | Admitting: Internal Medicine

## 2017-09-28 DIAGNOSIS — K5909 Other constipation: Secondary | ICD-10-CM

## 2017-10-28 DIAGNOSIS — D225 Melanocytic nevi of trunk: Secondary | ICD-10-CM | POA: Diagnosis not present

## 2017-10-28 DIAGNOSIS — C44319 Basal cell carcinoma of skin of other parts of face: Secondary | ICD-10-CM | POA: Diagnosis not present

## 2017-10-28 DIAGNOSIS — L814 Other melanin hyperpigmentation: Secondary | ICD-10-CM | POA: Diagnosis not present

## 2017-10-28 DIAGNOSIS — D0471 Carcinoma in situ of skin of right lower limb, including hip: Secondary | ICD-10-CM | POA: Diagnosis not present

## 2017-10-28 DIAGNOSIS — L821 Other seborrheic keratosis: Secondary | ICD-10-CM | POA: Diagnosis not present

## 2017-10-28 DIAGNOSIS — Z85828 Personal history of other malignant neoplasm of skin: Secondary | ICD-10-CM | POA: Diagnosis not present

## 2017-11-15 DIAGNOSIS — C44319 Basal cell carcinoma of skin of other parts of face: Secondary | ICD-10-CM | POA: Diagnosis not present

## 2017-11-15 DIAGNOSIS — Z85828 Personal history of other malignant neoplasm of skin: Secondary | ICD-10-CM | POA: Diagnosis not present

## 2017-11-15 DIAGNOSIS — D0471 Carcinoma in situ of skin of right lower limb, including hip: Secondary | ICD-10-CM | POA: Diagnosis not present

## 2017-11-15 DIAGNOSIS — C4441 Basal cell carcinoma of skin of scalp and neck: Secondary | ICD-10-CM | POA: Diagnosis not present

## 2017-11-29 ENCOUNTER — Other Ambulatory Visit: Payer: Self-pay | Admitting: Internal Medicine

## 2017-12-15 ENCOUNTER — Other Ambulatory Visit: Payer: Self-pay | Admitting: Internal Medicine

## 2018-01-21 ENCOUNTER — Telehealth: Payer: Self-pay | Admitting: *Deleted

## 2018-01-21 NOTE — Telephone Encounter (Signed)
Received letter from pt's insurance that sumatriptan tab 100mg  is subject to certain limits.  Pt's insurance will only cover 12 tabs every 30 days.    Attempted to contact patient to make her aware-no answer, message left on recorder.  Per review of pt's medical record, she uses the sumatriptan sparingly, therefore #12 a mth may be appropriate. Also spoke with pharmacy-pt was able to pick up her full rx (#30) at this time, but may get denied #30 on the next refill.  Since pt has already received her rx, no further action needed at this time.Despina Hidden Cassady1/25/20192:19 PM

## 2018-01-25 DIAGNOSIS — H2513 Age-related nuclear cataract, bilateral: Secondary | ICD-10-CM | POA: Diagnosis not present

## 2018-01-25 DIAGNOSIS — H25013 Cortical age-related cataract, bilateral: Secondary | ICD-10-CM | POA: Diagnosis not present

## 2018-01-25 DIAGNOSIS — H40013 Open angle with borderline findings, low risk, bilateral: Secondary | ICD-10-CM | POA: Diagnosis not present

## 2018-01-26 NOTE — Telephone Encounter (Signed)
Received return call from patient-she is not having any problems getting medication at this time and have pill on hand if she needs them.  Phone call complete.Shelly Campbell, Shelly Rothenberger Cassady1/30/201910:38 AM

## 2018-02-08 DIAGNOSIS — H18893 Other specified disorders of cornea, bilateral: Secondary | ICD-10-CM | POA: Diagnosis not present

## 2018-02-08 DIAGNOSIS — H40013 Open angle with borderline findings, low risk, bilateral: Secondary | ICD-10-CM | POA: Diagnosis not present

## 2018-02-08 DIAGNOSIS — H40053 Ocular hypertension, bilateral: Secondary | ICD-10-CM | POA: Diagnosis not present

## 2018-04-26 DIAGNOSIS — H2511 Age-related nuclear cataract, right eye: Secondary | ICD-10-CM | POA: Diagnosis not present

## 2018-04-26 DIAGNOSIS — H2513 Age-related nuclear cataract, bilateral: Secondary | ICD-10-CM | POA: Diagnosis not present

## 2018-05-17 DIAGNOSIS — D0439 Carcinoma in situ of skin of other parts of face: Secondary | ICD-10-CM | POA: Diagnosis not present

## 2018-05-17 DIAGNOSIS — L814 Other melanin hyperpigmentation: Secondary | ICD-10-CM | POA: Diagnosis not present

## 2018-05-17 DIAGNOSIS — I788 Other diseases of capillaries: Secondary | ICD-10-CM | POA: Diagnosis not present

## 2018-05-17 DIAGNOSIS — L570 Actinic keratosis: Secondary | ICD-10-CM | POA: Diagnosis not present

## 2018-05-17 DIAGNOSIS — Z85828 Personal history of other malignant neoplasm of skin: Secondary | ICD-10-CM | POA: Diagnosis not present

## 2018-05-17 DIAGNOSIS — L821 Other seborrheic keratosis: Secondary | ICD-10-CM | POA: Diagnosis not present

## 2018-06-17 DIAGNOSIS — H2511 Age-related nuclear cataract, right eye: Secondary | ICD-10-CM | POA: Diagnosis not present

## 2018-06-17 DIAGNOSIS — H25011 Cortical age-related cataract, right eye: Secondary | ICD-10-CM | POA: Diagnosis not present

## 2018-06-17 DIAGNOSIS — H2512 Age-related nuclear cataract, left eye: Secondary | ICD-10-CM | POA: Diagnosis not present

## 2018-07-15 DIAGNOSIS — H2589 Other age-related cataract: Secondary | ICD-10-CM | POA: Diagnosis not present

## 2018-07-15 DIAGNOSIS — H2512 Age-related nuclear cataract, left eye: Secondary | ICD-10-CM | POA: Diagnosis not present

## 2018-07-22 ENCOUNTER — Encounter: Payer: Self-pay | Admitting: Internal Medicine

## 2018-07-22 ENCOUNTER — Ambulatory Visit (INDEPENDENT_AMBULATORY_CARE_PROVIDER_SITE_OTHER): Payer: Medicare Other | Admitting: Internal Medicine

## 2018-07-22 ENCOUNTER — Other Ambulatory Visit: Payer: Self-pay

## 2018-07-22 VITALS — BP 113/66 | HR 59 | Temp 98.6°F | Ht 65.0 in | Wt 134.0 lb

## 2018-07-22 DIAGNOSIS — Z972 Presence of dental prosthetic device (complete) (partial): Secondary | ICD-10-CM

## 2018-07-22 DIAGNOSIS — G3184 Mild cognitive impairment, so stated: Secondary | ICD-10-CM | POA: Diagnosis not present

## 2018-07-22 DIAGNOSIS — Z79899 Other long term (current) drug therapy: Secondary | ICD-10-CM | POA: Diagnosis not present

## 2018-07-22 DIAGNOSIS — G43009 Migraine without aura, not intractable, without status migrainosus: Secondary | ICD-10-CM | POA: Diagnosis not present

## 2018-07-22 DIAGNOSIS — F429 Obsessive-compulsive disorder, unspecified: Secondary | ICD-10-CM | POA: Diagnosis not present

## 2018-07-22 DIAGNOSIS — M810 Age-related osteoporosis without current pathological fracture: Secondary | ICD-10-CM | POA: Diagnosis not present

## 2018-07-22 DIAGNOSIS — Z8639 Personal history of other endocrine, nutritional and metabolic disease: Secondary | ICD-10-CM

## 2018-07-22 DIAGNOSIS — N182 Chronic kidney disease, stage 2 (mild): Secondary | ICD-10-CM | POA: Diagnosis not present

## 2018-07-22 DIAGNOSIS — K121 Other forms of stomatitis: Secondary | ICD-10-CM | POA: Insufficient documentation

## 2018-07-22 DIAGNOSIS — Z85828 Personal history of other malignant neoplasm of skin: Secondary | ICD-10-CM | POA: Diagnosis not present

## 2018-07-22 MED ORDER — VITAMIN B-12 1000 MCG/15ML PO LIQD: 15.0000 mL | Freq: Every day | ORAL | 5 refills | Status: DC

## 2018-07-22 MED ORDER — SUMATRIPTAN SUCCINATE 100 MG PO TABS: 100.0000 mg | ORAL_TABLET | ORAL | 3 refills | Status: DC | PRN

## 2018-07-22 MED ORDER — CALCIUM CARBONATE-VITAMIN D 500-200 MG-UNIT PO TABS: 1.0000 | ORAL_TABLET | Freq: Two times a day (BID) | ORAL | 3 refills | Status: AC

## 2018-07-22 NOTE — Assessment & Plan Note (Addendum)
Assessment:  Pt with history of CKD and has renal function and Hb levels monitored annually. She requests these to be checked today which I reasonable given her history and her use of OTC supplements.      Plan: BMET and CBC ordered today. Will follow.

## 2018-07-22 NOTE — Assessment & Plan Note (Signed)
Assessment:  Pt diagnosed with osteoporosis on screening DEXA in 2014.  She has no history of pathologic fracture. She has been on Calcium and Vitamin D supplements which she buys OTC. She feels the calcium supplements she got from the [employer] are too chaulky and wonders if these are safe to take. Of note, repeat DEXA was ordered at our last visit but patient decided not to complete this as she does not want to start bisphosphonates.      Plan: Asked patient to discontinue the OTC supplements she purchased at the dollar store and discussed that these are not regulated as well as prescription medications and I cannot assure their safety. After reviewing her diet, I've sent in an Rx for Oscal 500/200 twice daily to her pharmacy.

## 2018-07-22 NOTE — Progress Notes (Signed)
Internal Medicine Clinic Attending  Case discussed with Dr. Molt at the time of the visit.  We reviewed the resident's history and exam and pertinent patient test results.  I agree with the assessment, diagnosis, and plan of care documented in the resident's note.  Alexander Raines, M.D., Ph.D.  

## 2018-07-22 NOTE — Assessment & Plan Note (Signed)
Assessment:  Patient here with 1 week history of painless ulceration on her hard pallette which she noticed when cleaning her dentures. She denies prior history of similar ulcerations and has been doing peroxide gargles. Reports it was initially about the size of a quarter but has shrunk dramatically. She just wanted to make sure it wasn't something that she should be worried about.      Plan: Exam with tiny ulceration on hard palette without surrounding erythema or purulence. Her dentures are clean and well cared for. Remainder of oral cavity wnl. She feels her dentures fit properly but thinks maybe a piece of food got under and caused some irritation. No indication for any Rx medication at this point and asked patient to stop peroxide gargles and instead focus on general oral health/hygiene. We discussed that if the ulcer does not go away entirely or if it reoccurs, she should contact clinic for further evaluation.

## 2018-07-22 NOTE — Progress Notes (Signed)
   CC: oral ulcer, follow-up of osteoporosis  HPI:  [name] is a 77 y.o. F with history of BCC, chronic migraines, OCD, history of vitamin D deficiency, osteoporosis and mild cognitive impairment who presents today for evaluation of an ulcer on the roof of her mouth. She otherwise has no complaints and feels well.   For details regarding today's visit and the status of their chronic medical issues, please refer to the assessment and plan.  Past Medical History:  Diagnosis Date  . Allergic rhinitis   . Anxiety   . Basal cell carcinoma    left temple  . Blood in stool    normal colonoscopy 2007  . Chronic fatigue syndrome   . Hyperlipidemia 08/26/2014  . Migraine   . Multiple chemical sensitivity syndrome   . OCD (obsessive compulsive disorder)   . Osteoporosis   . Osteoporosis   . Vitamin D deficiency    Review of Systems:   General: Denies fevers, chills, weight loss, fatigue HEENT: Denies acute changes in vision, sore throat, dysphagia, odynophagia, loose dentures Cardiac: Denies CP, SOB, palpitations Pulmonary: Denies coughing or wheezing Abd: Denies abdominal pain, changes in bowels Extremities: Denies weakness or swelling  Physical Exam: General: Alert, in no acute distress. Pleasant and conversant HEENT: No icterus, injection or ptosis. No hoarseness or dysarthria. Dentures removed, oral cavity wnl except for tiny ulceration on her hard palette. No surrounding erythema or purulence. No tongue lesions. Cardiac: RRR, no MGR appreciated Pulmonary: CTA BL with normal WOB on RA. Able to speak in complete sentences Abd: Soft, non-tender. +bs Extremities: Warm, perfused. No significant pedal edema. Ambulates without assistance.   Vitals:   07/22/18 1334  BP: 113/66  Pulse: (!) 59  Temp: 98.6 F (37 C)  TempSrc: Oral  SpO2: 97%  Weight: 134 lb (60.8 kg)  Height: 5\' 5"  (1.651 m)   Body mass index is 22.3 kg/m.  Assessment & Plan:   See Encounters Tab  for problem based charting.  Patient discussed with Dr. Rebeca Alert

## 2018-07-22 NOTE — Patient Instructions (Signed)
It was wonderful seeing you today! I'm glad you are doing well!  Today we talked about: #1) Oral Ulcer: I suspect this is related to a bit of food getting underneath your denture. Please continue monitoring this as you are, it should resolve within the next 1-2 weeks. If it does not, please let me know. If it reoccurs, you might consider seeing your dentist to have your dentures re-fitted.  #2) Migraines: I've sent in the prescription that was requested by your insurance company. If you have any problems with this, please let me know. I am happy to change you back to your old prescription although you would likely need to pay out of pocket.  #3) B12: I've sent in a prescription for sublingual B12 to your pharmacy.  #4) Health maintenance: I am checking standard labs as a part of your annual visit. I will call you with any abnormal results.   I'll see you back within the next year, although of course I'm happy to see you should any problem arise or if you have any questions.   Please stop by the lab on your way out!

## 2018-07-22 NOTE — Assessment & Plan Note (Signed)
Assessment:  Pt with long history of migraines controlled with prn Sumatriptan 100mg . She finds she takes about 4-9 a month and continues to get good relief of this. Her headaches have not changed and she asks for refill today.      Plan: Refills sent of Sumatriptan to pharmacy. Patient to notify me if her headaches change or are no longer controlled with current regimen.

## 2018-07-23 LAB — BMP8+ANION GAP
Anion Gap: 13 mmol/L (ref 10.0–18.0)
BUN / CREAT RATIO: 22 (ref 12–28)
BUN: 15 mg/dL (ref 8–27)
CALCIUM: 9.2 mg/dL (ref 8.7–10.3)
CO2: 25 mmol/L (ref 20–29)
Chloride: 105 mmol/L (ref 96–106)
Creatinine, Ser: 0.68 mg/dL (ref 0.57–1.00)
GFR calc Af Amer: 97 mL/min/{1.73_m2} (ref >59)
GFR calc non Af Amer: 84 mL/min/{1.73_m2} (ref >59)
GLUCOSE: 88 mg/dL (ref 65–99)
POTASSIUM: 4.4 mmol/L (ref 3.5–5.2)
Sodium: 143 mmol/L (ref 134–144)

## 2018-07-23 LAB — CBC
HEMOGLOBIN: 13 g/dL (ref 11.1–15.9)
Hematocrit: 40.4 % (ref 34.0–46.6)
MCH: 30.7 pg (ref 26.6–33.0)
MCHC: 32.2 g/dL (ref 31.5–35.7)
MCV: 96 fL (ref 79–97)
Platelets: 211 10*3/uL (ref 150–450)
RBC: 4.23 x10E6/uL (ref 3.77–5.28)
RDW: 13.1 % (ref 12.3–15.4)
WBC: 5.5 10*3/uL (ref 3.4–10.8)

## 2018-09-06 DIAGNOSIS — D225 Melanocytic nevi of trunk: Secondary | ICD-10-CM | POA: Diagnosis not present

## 2018-09-06 DIAGNOSIS — L821 Other seborrheic keratosis: Secondary | ICD-10-CM | POA: Diagnosis not present

## 2018-09-06 DIAGNOSIS — L814 Other melanin hyperpigmentation: Secondary | ICD-10-CM | POA: Diagnosis not present

## 2018-09-06 DIAGNOSIS — Z85828 Personal history of other malignant neoplasm of skin: Secondary | ICD-10-CM | POA: Diagnosis not present

## 2018-10-04 ENCOUNTER — Other Ambulatory Visit: Payer: Self-pay | Admitting: Internal Medicine

## 2018-10-11 ENCOUNTER — Ambulatory Visit (INDEPENDENT_AMBULATORY_CARE_PROVIDER_SITE_OTHER): Payer: Medicare Other | Admitting: *Deleted

## 2018-10-11 DIAGNOSIS — Z23 Encounter for immunization: Secondary | ICD-10-CM | POA: Diagnosis not present

## 2018-10-11 NOTE — Progress Notes (Signed)
Flu Shot given.  Sander Nephew, RN 10/11/2018 10:15 AM

## 2018-10-17 ENCOUNTER — Encounter: Payer: Self-pay | Admitting: *Deleted

## 2018-11-15 ENCOUNTER — Ambulatory Visit: Payer: Medicare Other

## 2018-11-15 ENCOUNTER — Telehealth: Payer: Self-pay | Admitting: *Deleted

## 2018-11-15 NOTE — Telephone Encounter (Signed)
Call from pt - stated she had "quite a bit of blood" when she went to the bathroom. But the next time, it wasn't as much. No c/o pain but some weakness. Blood dark in color not bright. No open appts this am; suggested going to the ED-she preferred not  b/c she does not want to "catch the flu" d/t her age also probably long wait.  Appt became open for today @ 1445 PM in [facility], pt scheduled- also informed pt if symptoms or bleeding become worse to go to the ED,voiced understanding.

## 2018-11-15 NOTE — Telephone Encounter (Signed)
Agree with plan. Thanks

## 2018-11-16 ENCOUNTER — Other Ambulatory Visit: Payer: Self-pay

## 2018-11-16 ENCOUNTER — Encounter: Payer: Self-pay | Admitting: Internal Medicine

## 2018-11-16 ENCOUNTER — Ambulatory Visit (INDEPENDENT_AMBULATORY_CARE_PROVIDER_SITE_OTHER): Payer: Medicare Other | Admitting: Internal Medicine

## 2018-11-16 VITALS — BP 132/74 | HR 85 | Temp 98.0°F | Ht 65.0 in | Wt 136.1 lb

## 2018-11-16 DIAGNOSIS — N390 Urinary tract infection, site not specified: Secondary | ICD-10-CM

## 2018-11-16 DIAGNOSIS — R310 Gross hematuria: Secondary | ICD-10-CM | POA: Diagnosis not present

## 2018-11-16 DIAGNOSIS — B9620 Unspecified Escherichia coli [E. coli] as the cause of diseases classified elsewhere: Secondary | ICD-10-CM | POA: Diagnosis not present

## 2018-11-16 DIAGNOSIS — R319 Hematuria, unspecified: Secondary | ICD-10-CM | POA: Diagnosis not present

## 2018-11-16 LAB — POCT URINALYSIS DIPSTICK
GLUCOSE UA: NEGATIVE
Ketones, UA: NEGATIVE
NITRITE UA: POSITIVE
Urobilinogen, UA: 0.2 E.U./dL
pH, UA: 5 (ref 5.0–8.0)

## 2018-11-16 MED ORDER — SULFAMETHOXAZOLE-TRIMETHOPRIM 800-160 MG PO TABS: 1.0000 | ORAL_TABLET | Freq: Two times a day (BID) | ORAL | 0 refills | Status: AC

## 2018-11-16 NOTE — Progress Notes (Signed)
   CC: hematuria  HPI:   Ms.Monia S Beitz is a 77 y.o. female with a medical history as listed below who presents to the internal medicine clinic for hematuria. Please see problem based charting for the history and status of the patient's current and chronic medical conditions.   Past Medical History:  Diagnosis Date  . Allergic rhinitis   . Anxiety   . Basal cell carcinoma    left temple  . Blood in stool    normal colonoscopy 2007  . Chronic fatigue syndrome   . Hyperlipidemia 08/26/2014  . Migraine   . Multiple chemical sensitivity syndrome   . OCD (obsessive compulsive disorder)   . Osteoporosis   . Osteoporosis   . Vitamin D deficiency     Review of Systems:   Pertinent positives mentioned in HPI. Remainder of all ROS negative.  Physical Exam: Vitals:   11/16/18 0916  BP: 132/74  Pulse: 85  Temp: 98 F (36.7 C)  TempSrc: Oral  SpO2: 97%  Weight: 136 lb 1.6 oz (61.7 kg)  Height: 5\' 5"  (1.651 m)   Physical Exam  Constitutional: Well-developed, well-nourished, and in no distress.  Eyes: Pupils are equal, round, and reactive to light. EOM are normal.  Cardiovascular: Normal rate and regular rhythm. No murmurs, rubs, or gallops. Pulmonary/Chest: Effort normal. Clear to auscultation bilaterally. No wheezes, rales, or rhonchi. Abdominal: Bowel sounds present. Soft, non-distended, non-tender. Back: No CVA tenderness. Ext: No lower extremity edema. Skin: Warm and dry. No rashes or wounds.   Assessment & Plan:   See Encounters Tab for problem based charting.  Patient seen with Dr. Angelia Mould

## 2018-11-16 NOTE — Progress Notes (Signed)
Internal Medicine Clinic Attending  I saw and evaluated the patient.  I personally confirmed the key portions of the history and exam documented by Dr. Dorrell and I reviewed pertinent patient test results.  The assessment, diagnosis, and plan were formulated together and I agree with the documentation in the resident's note. 

## 2018-11-16 NOTE — Patient Instructions (Signed)
It was a pleasure meeting you today, Ms. Darsey.  The blood in your urine is most likely caused by a urinary tract infection. - I have prescribed you a 3-day course of the antibiotic Bactrim. Take this medication twice a day for 3 days.  - I will call you with the results of your urine culture (hopefully in 2-3 days) - Please schedule an appointment to come back in 6 weeks for a repeat urine test.  Feel free to call our clinic at [phone] if you have any questions or if your symptoms come back.  Thanks, Dr. Annie Paras

## 2018-11-16 NOTE — Assessment & Plan Note (Addendum)
HPI: Shelly Campbell reports two episodes of gross hematuria two nights ago. The first episode was dark red with clots in it. The whole stream was this color. The second episode was a lighter brown color. Since then she has not seen any blood in the urine. She had mild suprapubic pain, urinary urgency, and urinary frequency before the episode of hematuria, but those symptoms have now resolved. She had dysuria only during the initial bloody void. She denies fevers, chills, or back pain. She has never had a UTI before. She is a never smoker. She is sure that this was urinary bleeding rather than vaginal or anal bleeding because she only had the bleeding while urinating and no blood was on her underwear before or after the episode. She walks a lot, but denies any recent trauma. She denies hematochezia or any other signs of bleeding. She denies dizziness, dyspnea, and fatigue.   Assessment: Unremarkable physical exam without abdominal pain or CVA tenderness. Urine dip shows small bilirubin, negative ketones, large blood, 100 protein, positive nitrites, and trace leukocytes. Despite her mild symptoms, the presence of nitrites on UA points to UTI. Will treat empirically with 3-day course of Bactrim and send urine for microscopy and culture. If culture is positive, the patient should return to clinic in 6 weeks for a repeat UA to ensure resolution of hematuria. If the culture is negative or if her repeat UA shows blood, she will need a CT abdomen/pelvis and referral to urology to further evaluate the cause of bleeding.   Plan 1. Bactrim DS BID for 3 days 2. Urine microscopy and culture 3. Follow-up in 6 weeks for repeat UA  ADDENDUM: Urine culture grew E. Coli sensitive to Bactrim. Patient has f/u appointment scheduled for repeat UA. Patient made aware.

## 2018-11-17 LAB — URINALYSIS, COMPLETE
Bilirubin, UA: NEGATIVE
GLUCOSE, UA: NEGATIVE
Ketones, UA: NEGATIVE
NITRITE UA: POSITIVE — AB
SPEC GRAV UA: 1.026 (ref 1.005–1.030)
Urobilinogen, Ur: 0.2 mg/dL (ref 0.2–1.0)
pH, UA: 5 (ref 5.0–7.5)

## 2018-11-17 LAB — MICROSCOPIC EXAMINATION
Casts: NONE SEEN /LPF
RBC, UA: >30 /HPF — AB (ref 0–2)
WBC, UA: >30 /HPF — AB (ref 0–5)

## 2018-11-18 LAB — URINE CULTURE

## 2018-12-06 DIAGNOSIS — Z85828 Personal history of other malignant neoplasm of skin: Secondary | ICD-10-CM | POA: Diagnosis not present

## 2018-12-06 DIAGNOSIS — D2239 Melanocytic nevi of other parts of face: Secondary | ICD-10-CM | POA: Diagnosis not present

## 2018-12-06 DIAGNOSIS — D485 Neoplasm of uncertain behavior of skin: Secondary | ICD-10-CM | POA: Diagnosis not present

## 2018-12-06 DIAGNOSIS — L986 Other infiltrative disorders of the skin and subcutaneous tissue: Secondary | ICD-10-CM | POA: Diagnosis not present

## 2018-12-06 DIAGNOSIS — L821 Other seborrheic keratosis: Secondary | ICD-10-CM | POA: Diagnosis not present

## 2018-12-06 DIAGNOSIS — L814 Other melanin hyperpigmentation: Secondary | ICD-10-CM | POA: Diagnosis not present

## 2018-12-06 DIAGNOSIS — L570 Actinic keratosis: Secondary | ICD-10-CM | POA: Diagnosis not present

## 2018-12-23 ENCOUNTER — Ambulatory Visit: Payer: Medicare Other

## 2018-12-30 ENCOUNTER — Encounter: Payer: Self-pay | Admitting: Internal Medicine

## 2018-12-30 ENCOUNTER — Ambulatory Visit (INDEPENDENT_AMBULATORY_CARE_PROVIDER_SITE_OTHER): Payer: Medicare Other | Admitting: Internal Medicine

## 2018-12-30 ENCOUNTER — Other Ambulatory Visit: Payer: Self-pay

## 2018-12-30 VITALS — BP 138/72 | HR 70 | Temp 97.9°F | Wt 138.6 lb

## 2018-12-30 DIAGNOSIS — N3090 Cystitis, unspecified without hematuria: Secondary | ICD-10-CM | POA: Diagnosis not present

## 2018-12-30 DIAGNOSIS — R319 Hematuria, unspecified: Secondary | ICD-10-CM | POA: Diagnosis not present

## 2018-12-30 DIAGNOSIS — Z87440 Personal history of urinary (tract) infections: Secondary | ICD-10-CM | POA: Diagnosis not present

## 2018-12-30 DIAGNOSIS — Z09 Encounter for follow-up examination after completed treatment for conditions other than malignant neoplasm: Secondary | ICD-10-CM | POA: Diagnosis not present

## 2018-12-30 NOTE — Progress Notes (Signed)
   CC: Follow-up for UTI  HPI:[name] is a 78 y.o. female who presents for evaluation of her hematuria and prior UTI. Please see individual problem based A/P for details.  Cystitis and gross hematuria: Was treated with bactrim for pan sensitive E. Coli in November after noticing gross hematuria. The hematuria was confirmed on complete UA. Although simple cystitis can present singularly with gross hematuria, it is a less than common presentation and warrants follow-up for confirmation of resolution of the hematuria. She denied symptoms including but not limited to dysuria, hematuria, flank pain, fever, chills, muscle aches, abdominal pain, urinary frequency or discolored urine since her last visit. The patient stated that she has felt well overall  Plan: We will obtain a UA complete to quantify any hemoglobinuria present or more accurately determine the lack thereof.  She will schedule to meet her PCP in 3-4 months  PHQ-9: Based on the patients    Office Visit from 12/30/2018 in [employer]  PHQ-9 Total Score  3     score we have decided to monitor.  Past Medical History:  Diagnosis Date  . Allergic rhinitis   . Anxiety   . Basal cell carcinoma    left temple  . Blood in stool    normal colonoscopy 2007  . Chronic fatigue syndrome   . Hyperlipidemia 08/26/2014  . Migraine   . Multiple chemical sensitivity syndrome   . OCD (obsessive compulsive disorder)   . Osteoporosis   . Osteoporosis   . Vitamin D deficiency    Review of Systems:  ROS negative except as per HPI.  Physical Exam: Vitals:   12/30/18 1421  BP: 138/72  Pulse: 70  Temp: 97.9 F (36.6 C)  TempSrc: Oral  Weight: 138 lb 9.6 oz (62.9 kg)   General: A/O x4, in no acute distress, afebrile, nondiaphoretic Cardio: RRR, no mrg's Pulmonary: CTA bilaterally  Assessment & Plan:   See Encounters Tab for problem based charting.  Patient discussed with Dr. Lynnae January

## 2018-12-30 NOTE — Patient Instructions (Signed)
FOLLOW-UP INSTRUCTIONS When: 3-4 months For: A routine visit with your primary care doctor, Dr. Koleen Distance What to bring: All of your medications  I have not made any changes to your medications today.   Today we discussed the blood in your urine. I have ordered a urinalysis. I will notify you of the results of any labs from today's evaluation when available to me.   As always if your symptoms worsen, fail to improve, or you develop other concerning symptoms, please notify our office or visit the local ER if we are unavailable as you feel appropriate. Symptoms including fever, chills, side or flank pain, recurrence of blood in your urine, should not be ignored and should encourage you to visit the ED if the symptoms are severe and we are unavailable.  Thank you for your visit to the [facility] today. As always, if you have any questions or concerns please call us at [phone] 24 hours a day 7 days a week. We have an after hours return call person to assist with difficult matters.

## 2018-12-30 NOTE — Progress Notes (Signed)
Internal Medicine Clinic Attending  Case discussed with Dr. Harbrecht at the time of the visit.  We reviewed the resident's history and exam and pertinent patient test results.  I agree with the assessment, diagnosis, and plan of care documented in the resident's note.   

## 2018-12-30 NOTE — Assessment & Plan Note (Signed)
  Cystitis and gross hematuria: Was treated with bactrim for pan sensitive E. Coli in November after noticing gross hematuria. The hematuria was confirmed on complete UA. Although simple cystitis can present singularly with gross hematuria, it is a less than common presentation and warrants follow-up for confirmation of resolution of the hematuria. She denied symptoms including but not limited to dysuria, hematuria, flank pain, fever, chills, muscle aches, abdominal pain, urinary frequency or discolored urine since her last visit. The patient stated that she has felt well overall  Plan: We will obtain a UA complete to quantify any hemoglobinuria present or more accurately determine the lack thereof.  She will schedule to meet her PCP in 3-4 months

## 2018-12-31 LAB — URINALYSIS, COMPLETE
Bilirubin, UA: NEGATIVE
Glucose, UA: NEGATIVE
Leukocytes, UA: NEGATIVE
Nitrite, UA: NEGATIVE
Protein, UA: NEGATIVE
RBC UA: NEGATIVE
Specific Gravity, UA: 1.018 (ref 1.005–1.030)
Urobilinogen, Ur: 0.2 mg/dL (ref 0.2–1.0)
pH, UA: 5 (ref 5.0–7.5)

## 2018-12-31 LAB — MICROSCOPIC EXAMINATION
BACTERIA UA: NONE SEEN
Casts: NONE SEEN /lpf

## 2019-01-02 ENCOUNTER — Telehealth: Payer: Self-pay | Admitting: Internal Medicine

## 2019-01-02 DIAGNOSIS — R319 Hematuria, unspecified: Secondary | ICD-10-CM

## 2019-01-02 NOTE — Addendum Note (Signed)
Addended by: Nicola Girt on: 01/02/2019 09:19 AM   Modules accepted: Orders

## 2019-01-02 NOTE — Telephone Encounter (Addendum)
Called to notify patient of lab results. There was no answer a message was left for the patient to return a call back to Dr. Berline Lopes when she was able. Given the persistence of the hematuria and microscopic crystals possibly concerning for nephrolithiasis, it is recommended that she undergo a CT with and without contrast of the abdomin. Prior to this, we would need to obtain a basic lab blood draw or a BMP as it has been greater than 6 months since her most recent renal function testing. If she would prefer to wait or avoid the CT scan(which I do not recommend), she will need to at the very least complete the lab and repeat a urinalysis in 6 weeks.   Kathi Ludwig, MD [facility] Internal Medicine, PGY-2   Addendum: Patient returned call, she was advised of the above. Orders for BMP lab appointment only and CT scan have been placed.  Kathi Ludwig, MD [facility] Internal Medicine, PGY-2

## 2019-01-03 ENCOUNTER — Other Ambulatory Visit: Payer: Self-pay | Admitting: *Deleted

## 2019-01-08 MED ORDER — FLUVOXAMINE MALEATE 100 MG PO TABS: 100.0000 mg | ORAL_TABLET | Freq: Two times a day (BID) | ORAL | 0 refills | Status: DC

## 2019-01-10 ENCOUNTER — Other Ambulatory Visit (INDEPENDENT_AMBULATORY_CARE_PROVIDER_SITE_OTHER): Payer: Medicare Other

## 2019-01-10 ENCOUNTER — Ambulatory Visit (HOSPITAL_COMMUNITY)
Admission: RE | Admit: 2019-01-10 | Discharge: 2019-01-10 | Disposition: A | Discharge status: Home / Self Care | Payer: Medicare Other | Source: Ambulatory Visit | Attending: Internal Medicine | Admitting: Internal Medicine

## 2019-01-10 DIAGNOSIS — R319 Hematuria, unspecified: Secondary | ICD-10-CM | POA: Diagnosis not present

## 2019-01-10 DIAGNOSIS — N390 Urinary tract infection, site not specified: Secondary | ICD-10-CM | POA: Diagnosis not present

## 2019-01-10 LAB — BASIC METABOLIC PANEL
Anion gap: 8 (ref 5–15)
BUN: 14 mg/dL (ref 8–23)
CALCIUM: 9.7 mg/dL (ref 8.9–10.3)
CHLORIDE: 105 mmol/L (ref 98–111)
CO2: 28 mmol/L (ref 22–32)
CREATININE: 0.68 mg/dL (ref 0.44–1.00)
GFR calc non Af Amer: >60 mL/min (ref >60)
GLUCOSE: 95 mg/dL (ref 70–99)
Potassium: 4.5 mmol/L (ref 3.5–5.1)
Sodium: 141 mmol/L (ref 135–145)

## 2019-01-10 MED ORDER — IOHEXOL 300 MG/ML  SOLN
100.0000 mL | Freq: Once | INTRAMUSCULAR | Status: AC | PRN
  Administered 2019-01-10: 100 mL via INTRAVENOUS

## 2019-01-10 NOTE — Addendum Note (Signed)
Addended by: Truddie Crumble on: 01/10/2019 03:13 PM   Modules accepted: Orders

## 2019-01-10 NOTE — Addendum Note (Signed)
Addended by: Truddie Crumble on: 01/10/2019 03:22 PM   Modules accepted: Orders

## 2019-01-11 NOTE — Addendum Note (Signed)
Addended by: Nicola Girt on: 01/11/2019 08:45 AM   Modules accepted: Orders

## 2019-01-18 ENCOUNTER — Telehealth: Payer: Self-pay | Admitting: Internal Medicine

## 2019-01-18 NOTE — Telephone Encounter (Signed)
Called and notified the patient of the results of her CT scan and need for Urology appointment. She is in agreement with the appointment and understood the results.   Kathi Ludwig, MD [facility] Internal Medicine, PGY-2

## 2019-03-10 DIAGNOSIS — R310 Gross hematuria: Secondary | ICD-10-CM | POA: Diagnosis not present

## 2019-04-05 ENCOUNTER — Other Ambulatory Visit: Payer: Self-pay | Admitting: Internal Medicine

## 2019-04-05 DIAGNOSIS — K5909 Other constipation: Secondary | ICD-10-CM

## 2019-06-13 ENCOUNTER — Other Ambulatory Visit: Payer: Self-pay | Admitting: Internal Medicine

## 2019-06-15 NOTE — Telephone Encounter (Signed)
Spoke with the patient this moring.  She is sch for 07/17/2019 with PCP Dr Koleen Distance.  Pt is requesting appt to be a TeleHealth Visit instead of an in person visit due to COVID-19.  Pt is afraid to come to the office.  Please advise if this appt can be a Telehealth visit on 07/17/2019 instead.

## 2019-07-17 ENCOUNTER — Encounter: Payer: Medicare Other | Admitting: Internal Medicine

## 2019-07-27 ENCOUNTER — Other Ambulatory Visit: Payer: Self-pay | Admitting: *Deleted

## 2019-07-27 MED ORDER — SUMATRIPTAN SUCCINATE 100 MG PO TABS: 100.0000 mg | ORAL_TABLET | ORAL | 3 refills | Status: DC | PRN

## 2019-07-27 NOTE — Telephone Encounter (Signed)
Last office visit 12/30/18 [place] Visit w/pcp on 07/17/19 was cancelled-lack of transportation No future appts scheduled Will send request to front office to assist with appt

## 2019-07-31 ENCOUNTER — Ambulatory Visit (INDEPENDENT_AMBULATORY_CARE_PROVIDER_SITE_OTHER): Payer: Medicare Other | Admitting: Internal Medicine

## 2019-07-31 ENCOUNTER — Other Ambulatory Visit: Payer: Self-pay

## 2019-07-31 ENCOUNTER — Encounter: Payer: Self-pay | Admitting: Internal Medicine

## 2019-07-31 DIAGNOSIS — F429 Obsessive-compulsive disorder, unspecified: Secondary | ICD-10-CM | POA: Diagnosis not present

## 2019-07-31 DIAGNOSIS — Z79899 Other long term (current) drug therapy: Secondary | ICD-10-CM

## 2019-07-31 DIAGNOSIS — R310 Gross hematuria: Secondary | ICD-10-CM | POA: Diagnosis not present

## 2019-07-31 DIAGNOSIS — G43009 Migraine without aura, not intractable, without status migrainosus: Secondary | ICD-10-CM

## 2019-07-31 DIAGNOSIS — R319 Hematuria, unspecified: Secondary | ICD-10-CM

## 2019-07-31 NOTE — Progress Notes (Signed)
  [facility] Internal Medicine Residency Telephone Encounter Continuity Care Appointment  HPI:   This telephone encounter was created for [name] on 07/31/2019 for the following purpose/cc hematuria, chronic migraines .   Past Medical History:  Past Medical History:  Diagnosis Date  . Allergic rhinitis   . Anxiety   . Basal cell carcinoma    left temple  . Blood in stool    normal colonoscopy 2007  . Chronic fatigue syndrome   . Hyperlipidemia 08/26/2014  . Migraine   . Multiple chemical sensitivity syndrome   . OCD (obsessive compulsive disorder)   . Osteoporosis   . Osteoporosis   . Vitamin D deficiency       ROS:   Negative for fevers, chills, cough, shortness of breath, chest pain, dizziness, falls, change in chronic headaches, abdominal pain, constipation, hematuria, or dysuria.    Assessment / Plan / Recommendations:   Please see A&P under problem oriented charting for assessment of the patient's acute and chronic medical conditions.   As always, pt is advised that if symptoms worsen or new symptoms arise, they should go to an urgent care facility or to to ER for further evaluation.   Consent and Medical Decision Making:   Patient discussed with Dr. Dareen Piano  This is a telephone encounter between Shelly Campbell and Shelly Campbell on 07/31/2019 for hematuria, chronic migraines. The visit was conducted with the patient located at home and Shelly Campbell at [facility]. The patient's identity was confirmed using their DOB and current address. The patient has consented to being evaluated through a telephone encounter and understands the associated risks (an examination cannot be done and the patient may need to come in for an appointment) / benefits (allows the patient to remain at home, decreasing exposure to coronavirus). I personally spent 15 minutes on medical discussion.

## 2019-07-31 NOTE — Assessment & Plan Note (Signed)
Chronic, stable. well-controlled on Fluvoxamine. Has not noticed worsening symptoms during COVID pandemic. Keeps herself busy by helping clean her daughter's house.

## 2019-07-31 NOTE — Assessment & Plan Note (Signed)
Patient was referred to urology in march for gross painless hematuria. Clinic notes reviewed. Cystoscopy was unremarkable. Recommended follow-up in 6 months. She notes hematuria has now resolved for the last few months. Will continue to monitor.

## 2019-07-31 NOTE — Assessment & Plan Note (Signed)
Patient with lonstanding history of migraine headaches well controlled on prn Sumatriptan. She gets approximately 3-4 per month. No changes in frequency, intensity or triggers.

## 2019-08-01 NOTE — Progress Notes (Signed)
Internal Medicine Clinic Attending  Case discussed with Dr. Bloomfield at the time of the visit.  We reviewed the resident's history and exam and pertinent patient test results.  I agree with the assessment, diagnosis, and plan of care documented in the resident's note.  

## 2019-09-21 DIAGNOSIS — Z23 Encounter for immunization: Secondary | ICD-10-CM | POA: Diagnosis not present

## 2019-11-16 ENCOUNTER — Other Ambulatory Visit: Payer: Self-pay | Admitting: Internal Medicine

## 2019-12-28 ENCOUNTER — Other Ambulatory Visit: Payer: Self-pay | Admitting: Internal Medicine

## 2020-01-09 ENCOUNTER — Encounter: Payer: Self-pay | Admitting: Emergency Medicine

## 2020-01-09 ENCOUNTER — Ambulatory Visit
Admission: EM | Admit: 2020-01-09 | Discharge: 2020-01-09 | Disposition: A | Discharge status: Home / Self Care | Payer: Medicare Other | Attending: Emergency Medicine | Admitting: Emergency Medicine

## 2020-01-09 ENCOUNTER — Other Ambulatory Visit: Payer: Self-pay

## 2020-01-09 DIAGNOSIS — J029 Acute pharyngitis, unspecified: Secondary | ICD-10-CM

## 2020-01-09 DIAGNOSIS — R0982 Postnasal drip: Secondary | ICD-10-CM | POA: Diagnosis not present

## 2020-01-09 DIAGNOSIS — Z20822 Contact with and (suspected) exposure to covid-19: Secondary | ICD-10-CM | POA: Diagnosis not present

## 2020-01-09 MED ORDER — LIDOCAINE VISCOUS HCL 2 % MT SOLN
15.0000 mL | Freq: Once | OROMUCOSAL | Status: AC
  Administered 2020-01-09: 15 mL via OROMUCOSAL

## 2020-01-09 MED ORDER — FLUTICASONE PROPIONATE 50 MCG/ACT NA SUSP: 1.0000 | Freq: Every day | NASAL | 0 refills | Status: DC

## 2020-01-09 MED ORDER — LORATADINE 10 MG PO TABS: 10.0000 mg | ORAL_TABLET | Freq: Every day | ORAL | 0 refills | Status: DC

## 2020-01-09 NOTE — ED Triage Notes (Signed)
Pt presents to [facility] for assessment of sore throat x 3 days.  C/o loss of taste, headache, chills, mild cough.  Denies nasal congestion.

## 2020-01-09 NOTE — Discharge Instructions (Addendum)
Your COVID test is pending - it is important to quarantine / isolate at home until your results are back. °If you test positive and would like further evaluation for persistent or worsening symptoms, you may schedule an E-visit or virtual (video) visit throughout the [facility] My[facility] app or website. ° °PLEASE NOTE: If you develop severe chest pain or shortness of breath please go to the ER or call 9-1-1 for further evaluation --> DO NOT schedule electronic or virtual visits for this. °Please call our office for further guidance / recommendations as needed. °

## 2020-01-09 NOTE — ED Provider Notes (Signed)
[facility]    CSN: [id number] Arrival date & time: 01/09/20  1744      History   Chief Complaint Chief Complaint  Patient presents with  . URI    HPI Shelly Campbell is a 79 y.o. female with history of allergies, migraines, osteoporosis presenting for 3-day course of sore throat, loss of taste, general headaches, mild, dry cough.  States she had chills yesterday which prompted evaluation today.  Denies known fever, difficulty breathing, chest pain.  Has not take anything for symptoms.  No known sick contacts.   Past Medical History:  Diagnosis Date  . Allergic rhinitis   . Anxiety   . Basal cell carcinoma    left temple  . Blood in stool    normal colonoscopy 2007  . Chronic fatigue syndrome   . Hyperlipidemia 08/26/2014  . Migraine   . Multiple chemical sensitivity syndrome   . OCD (obsessive compulsive disorder)   . Osteoporosis   . Osteoporosis   . Vitamin D deficiency     Patient Active Problem List   Diagnosis Date Noted  . Hematuria 11/16/2018  . Oral ulceration 07/22/2018  . History of basal cell cancer 07/20/2017  . Mild cognitive impairment 07/17/2016  . Chronic kidney disease (CKD), stage II (mild) 08/16/2015  . Vitamin D insufficiency 08/26/2014  . Hyperlipidemia 08/26/2014  . Healthcare maintenance 08/26/2014  . Chronic constipation 08/26/2014  . Schatzki's ring 12/09/2011  . Osteoporosis 03/14/2009  . Obsessive-compulsive disorder 04/06/2007  . Migraine headache without aura 04/06/2007    Past Surgical History:  Procedure Laterality Date  . BREAST ENHANCEMENT SURGERY     bilat, 1973    OB History   No obstetric history on file.      Home Medications    Prior to Admission medications   Medication Sig Start Date End Date Taking? Authorizing Provider  calcium-vitamin D (OSCAL 500/200 D-3) 500-200 MG-UNIT tablet Take 1 tablet by mouth 2 (two) times daily. 07/22/18 07/22/19  Molt, Bethany, DO  Cyanocobalamin (VITAMIN B-12)  1000 MCG/15ML LIQD Take 15 mLs (1,000 mcg total) by mouth daily. 07/22/18   Molt, Bethany, DO  fluticasone (FLONASE) 50 MCG/ACT nasal spray Place 1 spray into both nostrils daily. 01/09/20   Hall-Potvin, [place], PA-C  fluvoxaMINE (LUVOX) 100 MG tablet TAKE 1 TABLET(100 MG) BY MOUTH TWICE DAILY 11/17/19   Bloomfield, Carley D, DO  loratadine (CLARITIN) 10 MG tablet Take 1 tablet (10 mg total) by mouth daily. 01/09/20   Hall-Potvin, [place], PA-C  polyethylene glycol powder (GLYCOLAX/MIRALAX) 17 GM/SCOOP powder DISSOLVE 17 GRAMS IN 8 OUNCES OF WATER AND DRINK BY MOUTH ONCE DAILY 04/09/19   Bloomfield, Carley D, DO  SUMAtriptan (IMITREX) 100 MG tablet TAKE 1 TABLET(100 MG) BY MOUTH EVERY 2 HOURS AS NEEDED FOR MIGRAINE. MAY REPEAT IN 2 HOURS IF HEADACHE PERSISTS OR RECURS 01/01/20   Modena Nunnery D, DO    Family History Family History  Problem Relation Age of Onset  . ALS Father   . Heart disease Father   . Colon polyps Mother   . Heart attack Mother   . Breast cancer Mother     Social History Social History   Tobacco Use  . Smoking status: Never Smoker  . Smokeless tobacco: Never Used  Substance Use Topics  . Alcohol use: No  . Drug use: No     Allergies   Patient has no known allergies.   Review of Systems As per HPI   Physical Exam Triage Vital  Signs ED Triage Vitals  Enc Vitals Group     BP 01/09/20 1802 (!) 147/90     Pulse Rate 01/09/20 1802 94     Resp 01/09/20 1802 18     Temp 01/09/20 1802 98.1 F (36.7 C)     Temp Source 01/09/20 1802 Temporal     SpO2 01/09/20 1802 95 %     Weight --      Height --      Head Circumference --      Peak Flow --      Pain Score 01/09/20 1804 0     Pain Loc --      Pain Edu? --      Excl. in [place]? --    No data found.  Updated Vital Signs BP (!) 147/90 (BP Location: Left Arm)   Pulse 94   Temp 98.1 F (36.7 C) (Temporal)   Resp 18   SpO2 95%   Visual Acuity Right Eye Distance:   Left Eye Distance:   Bilateral  Distance:    Right Eye Near:   Left Eye Near:    Bilateral Near:     Physical Exam Constitutional:      General: She is not in acute distress.    Appearance: She is normal weight. She is not ill-appearing or diaphoretic.  HENT:     Head: Normocephalic and atraumatic.     Jaw: There is normal jaw occlusion. No tenderness or pain on movement.     Right Ear: Hearing, tympanic membrane, ear canal and external ear normal. No tenderness. No mastoid tenderness.     Left Ear: Hearing, tympanic membrane, ear canal and external ear normal. No tenderness. No mastoid tenderness.     Nose: Nose normal. No nasal deformity, septal deviation or nasal tenderness.     Right Turbinates: Not swollen or pale.     Left Turbinates: Not swollen or pale.     Right Sinus: No maxillary sinus tenderness or frontal sinus tenderness.     Left Sinus: No maxillary sinus tenderness or frontal sinus tenderness.     Comments: No sinus tenderness bilaterally    Mouth/Throat:     Lips: Pink. No lesions.     Mouth: Mucous membranes are moist. No injury.     Pharynx: Oropharynx is clear. Uvula midline. No posterior oropharyngeal erythema or uvula swelling.     Comments: no tonsillar exudate or hypertrophy.  Cobblestoning present Eyes:     General: No scleral icterus.    Conjunctiva/sclera: Conjunctivae normal.     Pupils: Pupils are equal, round, and reactive to light.  Cardiovascular:     Rate and Rhythm: Normal rate and regular rhythm.  Pulmonary:     Effort: Pulmonary effort is normal. No respiratory distress.     Breath sounds: No wheezing.  Musculoskeletal:     Cervical back: Normal range of motion and neck supple. No tenderness. No muscular tenderness.  Lymphadenopathy:     Cervical: No cervical adenopathy.  Skin:    Capillary Refill: Capillary refill takes less than 2 seconds.     Coloration: Skin is not jaundiced or pale.  Neurological:     General: No focal deficit present.     Mental Status: She is  alert and oriented to person, place, and time.      UC Treatments / Results  Labs (all labs ordered are listed, but only abnormal results are displayed) Labs Reviewed  NOVEL CORONAVIRUS, NAA    EKG  Radiology No results found.  Procedures Procedures (including critical care time)  Medications Ordered in UC Medications  lidocaine (XYLOCAINE) 2 % viscous mouth solution 15 mL (15 mLs Mouth/Throat Given 01/09/20 1846)    Initial Impression / Assessment and Plan / UC Course  I have reviewed the triage vital signs and the nursing notes.  Pertinent labs & imaging results that were available during my care of the patient were reviewed by me and considered in my medical decision making (see chart for details).     Patient afebrile, nontoxic, with SpO2 95%.  Covid PCR pending.  Patient to quarantine until results are back.  Trialed 15 mils of viscous lidocaine: Reporting some improvement in sore throat, though patient has difficulty gargling.  Will defer prescription of this in lieu of supportive management OTC.  Return precautions discussed, patient verbalized understanding and is agreeable to plan. Final Clinical Impressions(s) / UC Diagnoses   Final diagnoses:  Sore throat  Post-nasal drip     Discharge Instructions     Your COVID test is pending - it is important to quarantine / isolate at home until your results are back. If you test positive and would like further evaluation for persistent or worsening symptoms, you may schedule an E-visit or virtual (video) visit throughout the [facility] app or website.  PLEASE NOTE: If you develop severe chest pain or shortness of breath please go to the ER or call 9-1-1 for further evaluation --> DO NOT schedule electronic or virtual visits for this. Please call our office for further guidance / recommendations as needed.    ED Prescriptions    Medication Sig Dispense Auth. Provider   loratadine (CLARITIN) 10 MG tablet  Take 1 tablet (10 mg total) by mouth daily. 30 tablet Hall-Potvin, [place], PA-C   fluticasone (FLONASE) 50 MCG/ACT nasal spray Place 1 spray into both nostrils daily. 16 g Hall-Potvin, [place], PA-C     PDMP not reviewed this encounter.   Neldon Mc Prescott, [place] 01/09/20 1927

## 2020-01-09 NOTE — ED Notes (Signed)
Patient able to ambulate independently  

## 2020-01-11 LAB — NOVEL CORONAVIRUS, NAA: SARS-CoV-2, NAA: NOT DETECTED

## 2020-02-19 ENCOUNTER — Other Ambulatory Visit: Payer: Self-pay | Admitting: Internal Medicine

## 2020-02-20 ENCOUNTER — Other Ambulatory Visit: Payer: Self-pay | Admitting: Internal Medicine

## 2020-05-21 ENCOUNTER — Other Ambulatory Visit: Payer: Self-pay | Admitting: Internal Medicine

## 2020-07-17 ENCOUNTER — Other Ambulatory Visit: Payer: Self-pay | Admitting: Internal Medicine

## 2020-07-17 NOTE — Telephone Encounter (Signed)
Lst visit:  07/31/2019 (telehealth) °Next visit: none scheduled ° °Will send refill request to appropriate team and appt request to front office..,  [place]7/21/202110:46 AM ° ° ° °

## 2020-08-19 ENCOUNTER — Other Ambulatory Visit: Payer: Self-pay | Admitting: Internal Medicine

## 2020-08-20 NOTE — Telephone Encounter (Signed)
Pt appt 09/30/2020 @ 9:15 with Dr. Koleen Distance.

## 2020-09-30 ENCOUNTER — Encounter: Payer: Medicare Other | Admitting: Internal Medicine

## 2020-10-04 DIAGNOSIS — Z23 Encounter for immunization: Secondary | ICD-10-CM | POA: Diagnosis not present

## 2020-10-22 ENCOUNTER — Telehealth: Payer: Self-pay | Admitting: Emergency Medicine

## 2020-10-22 ENCOUNTER — Ambulatory Visit
Admission: EM | Admit: 2020-10-22 | Discharge: 2020-10-22 | Disposition: A | Discharge status: Home / Self Care | Payer: Medicare Other | Attending: Emergency Medicine | Admitting: Emergency Medicine

## 2020-10-22 DIAGNOSIS — Z0000 Encounter for general adult medical examination without abnormal findings: Secondary | ICD-10-CM

## 2020-10-22 DIAGNOSIS — R42 Dizziness and giddiness: Secondary | ICD-10-CM | POA: Diagnosis not present

## 2020-10-22 LAB — COMPREHENSIVE METABOLIC PANEL
ALT: 12 IU/L (ref 0–32)
AST: 22 IU/L (ref 0–40)
Albumin/Globulin Ratio: 2 (ref 1.2–2.2)
Albumin: 4.3 g/dL (ref 3.6–4.6)
Alkaline Phosphatase: 96 IU/L (ref 44–121)
BUN/Creatinine Ratio: 22 (ref 12–28)
BUN: 14 mg/dL (ref 8–27)
Bilirubin Total: 0.4 mg/dL (ref 0.0–1.2)
CO2: 27 mmol/L (ref 20–29)
Calcium: 9.2 mg/dL (ref 8.7–10.3)
Chloride: 106 mmol/L (ref 96–106)
Creatinine, Ser: 0.63 mg/dL (ref 0.57–1.00)
GFR calc Af Amer: 97 mL/min/{1.73_m2} (ref >59)
GFR calc non Af Amer: 84 mL/min/{1.73_m2} (ref >59)
Globulin, Total: 2.2 g/dL (ref 1.5–4.5)
Glucose: 94 mg/dL (ref 65–99)
Potassium: 3.9 mmol/L (ref 3.5–5.2)
Sodium: 141 mmol/L (ref 134–144)
Total Protein: 6.5 g/dL (ref 6.0–8.5)

## 2020-10-22 LAB — POCT URINALYSIS DIP (MANUAL ENTRY)
Bilirubin, UA: NEGATIVE
Glucose, UA: NEGATIVE mg/dL
Ketones, POC UA: NEGATIVE mg/dL
Nitrite, UA: NEGATIVE
Protein Ur, POC: NEGATIVE mg/dL
Spec Grav, UA: >=1.03 — AB (ref 1.010–1.025)
Urobilinogen, UA: 0.2 E.U./dL
pH, UA: 6 (ref 5.0–8.0)

## 2020-10-22 LAB — CBC WITH DIFFERENTIAL/PLATELET
Basophils Absolute: 0 10*3/uL (ref 0.0–0.2)
Basos: 1 %
EOS (ABSOLUTE): 0.1 10*3/uL (ref 0.0–0.4)
Eos: 3 %
Hematocrit: 40.6 % (ref 34.0–46.6)
Hemoglobin: 13.9 g/dL (ref 11.1–15.9)
Lymphocytes Absolute: 1.6 10*3/uL (ref 0.7–3.1)
Lymphs: 36 %
MCH: 32 pg (ref 26.6–33.0)
MCHC: 34.2 g/dL (ref 31.5–35.7)
MCV: 94 fL (ref 79–97)
Monocytes Absolute: 0.4 10*3/uL (ref 0.1–0.9)
Monocytes: 10 %
Neutrophils Absolute: 2.2 10*3/uL (ref 1.4–7.0)
Neutrophils: 50 %
Platelets: 184 10*3/uL (ref 150–450)
RBC: 4.34 x10E6/uL (ref 3.77–5.28)
RDW: 13.1 % (ref 11.7–15.4)
WBC: 4.3 10*3/uL (ref 3.4–10.8)

## 2020-10-22 MED ORDER — MECLIZINE HCL 25 MG PO CHEW: 25.0000 mg | CHEWABLE_TABLET | Freq: Two times a day (BID) | ORAL | 0 refills | Status: AC | PRN

## 2020-10-22 MED ORDER — CETIRIZINE HCL 5 MG PO TABS: 5.0000 mg | ORAL_TABLET | Freq: Every day | ORAL | 0 refills | Status: DC

## 2020-10-22 MED ORDER — MECLIZINE HCL 25 MG PO TABS: 25.0000 mg | ORAL_TABLET | Freq: Two times a day (BID) | ORAL | 0 refills | Status: DC | PRN

## 2020-10-22 MED ORDER — CETIRIZINE HCL 1 MG/ML PO SOLN: 5.0000 mg | Freq: Every day | ORAL | 0 refills | Status: DC

## 2020-10-22 NOTE — ED Provider Notes (Signed)
Emergency Department Provider Note  ____________________________________________  Time seen: Approximately 10:58 AM  I have reviewed the triage vital signs and the nursing notes.   HISTORY  Chief Complaint Dizziness   Historian Patient     HPI Shelly Campbell is a 79 y.o. female with a history of allergic rhinitis, migraines and chronic fatigue, presents to the urgent care with intermittent dizziness that has occurred for the past 6 months.  Patient states that she typically experiences dizziness only from going from a sitting to a standing position.  She states that she was more symptomatic yesterday and states that she has not experienced really any dizziness at all today.  She denies headache or numbness or tingling in the upper extremities.  No weakness of the arms and legs.  She denies falls or mechanisms of trauma.  Denies chest pain, chest tightness and shortness of breath.  She states that she has been avoiding seeking medical care for dizziness as she does not want to be in the hospital due to COVID-19.  No associated rhinorrhea, nasal congestion or nonproductive cough.  No other alleviating measures have been attempted.   Past Medical History:  Diagnosis Date  . Allergic rhinitis   . Anxiety   . Basal cell carcinoma    left temple  . Blood in stool    normal colonoscopy 2007  . Chronic fatigue syndrome   . Hyperlipidemia 08/26/2014  . Migraine   . Multiple chemical sensitivity syndrome   . OCD (obsessive compulsive disorder)   . Osteoporosis   . Osteoporosis   . Vitamin D deficiency      Immunizations up to date:  Yes.     Past Medical History:  Diagnosis Date  . Allergic rhinitis   . Anxiety   . Basal cell carcinoma    left temple  . Blood in stool    normal colonoscopy 2007  . Chronic fatigue syndrome   . Hyperlipidemia 08/26/2014  . Migraine   . Multiple chemical sensitivity syndrome   . OCD (obsessive compulsive disorder)   . Osteoporosis    . Osteoporosis   . Vitamin D deficiency     Patient Active Problem List   Diagnosis Date Noted  . Hematuria 11/16/2018  . Oral ulceration 07/22/2018  . History of basal cell cancer 07/20/2017  . Mild cognitive impairment 07/17/2016  . Chronic kidney disease (CKD), stage II (mild) 08/16/2015  . Vitamin D insufficiency 08/26/2014  . Hyperlipidemia 08/26/2014  . Healthcare maintenance 08/26/2014  . Chronic constipation 08/26/2014  . Schatzki's ring 12/09/2011  . Osteoporosis 03/14/2009  . Obsessive-compulsive disorder 04/06/2007  . Migraine headache without aura 04/06/2007    Past Surgical History:  Procedure Laterality Date  . BREAST ENHANCEMENT SURGERY     bilat, 1973    Prior to Admission medications   Medication Sig Start Date End Date Taking? Authorizing Provider  calcium-vitamin D (OSCAL 500/200 D-3) 500-200 MG-UNIT tablet Take 1 tablet by mouth 2 (two) times daily. 07/22/18 07/22/19  Molt, Bethany, DO  Cyanocobalamin (VITAMIN B-12) 1000 MCG/15ML LIQD Take 15 mLs (1,000 mcg total) by mouth daily. 07/22/18   Molt, Bethany, DO  fluvoxaMINE (LUVOX) 100 MG tablet TAKE 1 TABLET(100 MG) BY MOUTH TWICE DAILY 08/20/20   Masoudi, Elhamalsadat, MD  polyethylene glycol powder (GLYCOLAX/MIRALAX) 17 GM/SCOOP powder DISSOLVE 17 GRAMS IN 8 OUNCES OF WATER AND DRINK BY MOUTH ONCE DAILY 04/09/19   Bloomfield, Carley D, DO  SUMAtriptan (IMITREX) 100 MG tablet TAKE 1 TABLET(100 MG) BY MOUTH  EVERY 2 HOURS AS NEEDED FOR MIGRAINE. MAY REPEAT IN 2 HOURS IF HEADACHE PERSISTS OR RECURS 07/17/20   Modena Nunnery D, DO    Allergies Patient has no known allergies.  Family History  Problem Relation Age of Onset  . ALS Father   . Heart disease Father   . Colon polyps Mother   . Heart attack Mother   . Breast cancer Mother     Social History Social History   Tobacco Use  . Smoking status: Never Smoker  . Smokeless tobacco: Never Used  Substance Use Topics  . Alcohol use: No  . Drug use:  No     Review of Systems  Constitutional: No fever/chills Eyes:  No discharge ENT: No upper respiratory complaints. Respiratory: no cough. No SOB/ use of accessory muscles to breath Gastrointestinal:   No nausea, no vomiting.  No diarrhea.  No constipation. Musculoskeletal: Negative for musculoskeletal pain. Neuro: Patient has dizziness  Skin: Negative for rash, abrasions, lacerations, ecchymosis.    ____________________________________________   PHYSICAL EXAM:  VITAL SIGNS: ED Triage Vitals [10/22/20 1045]  Enc Vitals Group     BP (!) 155/80     Pulse Rate 71     Resp 18     Temp 97.9 F (36.6 C)     Temp Source Oral     SpO2      Weight      Height      Head Circumference      Peak Flow      Pain Score 0     Pain Loc      Pain Edu?      Excl. in [place]?      Constitutional: Alert and oriented. Well appearing and in no acute distress.  Patient is able to provide history.  She speaks in complete sentences. Eyes: Conjunctivae are normal. PERRL. EOMI. Head: Atraumatic.  Cranial nerves II through XII are intact. ENT:      Ears: TMs are effused bilaterally.       Nose: No congestion/rhinnorhea.      Mouth/Throat: Mucous membranes are moist.  Neck: No stridor. Full range of motion.  Hematological/Lymphatic/Immunilogical: No cervical lymphadenopathy. Cardiovascular: Normal rate, regular rhythm. Normal S1 and S2.  Good peripheral circulation. Respiratory: Normal respiratory effort without tachypnea or retractions. Lungs CTAB. Good air entry to the bases with no decreased or absent breath sounds Gastrointestinal: Bowel sounds x 4 quadrants. Soft and nontender to palpation. No guarding or rigidity. No distention. Musculoskeletal: Patient has symmetric strength in the upper and lower extremities full range of motion to all extremities. No obvious deformities noted.  Negative Romberg.  Patient can perform rapid alternating movements. Neurologic:  Normal for age. No gross focal  neurologic deficits are appreciated.  Skin:  Skin is warm, dry and intact. No rash noted. Psychiatric: Mood and affect are normal for age. Speech and behavior are normal.   ____________________________________________   LABS (all labs ordered are listed, but only abnormal results are displayed)  Labs Reviewed  CBC WITH DIFFERENTIAL/PLATELET  COMPREHENSIVE METABOLIC PANEL  POCT URINALYSIS DIP (MANUAL ENTRY)   ____________________________________________  EKG   ____________________________________________  RADIOLOGY   No results found.  ____________________________________________    PROCEDURES  Procedure(s) performed:     Procedures     Medications - No data to display   ____________________________________________   INITIAL IMPRESSION / ASSESSMENT AND PLAN / ED COURSE  Pertinent labs & imaging results that were available during my care of the patient were  reviewed by me and considered in my medical decision making (see chart for details).      Assessment and Plan:  Dizziness 79 year old female presents to the emergency department with dizziness that is occurred intermittently over the past 6 months.  Patient was mildly hypertensive at triage but vital signs were otherwise reassuring.  Patient was alert, active and nontoxic-appearing with no neuro deficits noted.  Differential diagnosis includes electrolyte abnormality, dehydration, UTI, arrhythmia, benign positional vertigo...  We will obtain CBC, CMP, UA and EKG in the urgent care.  Do not feel that CT head is indicated at this time as patient does not have any neuro deficits on exam and is not complaining of headache or weakness and has been symptomatic for the past 6 months.  EKG reveals normal sinus rhythm with scattered premature atrial complexes.  No ST segment elevation or apparent arrhythmia.  While labs are in process, will start patient on meclizine and Zyrtec and will contact patient this  afternoon by phone with results.     ____________________________________________  FINAL CLINICAL IMPRESSION(S) / ED DIAGNOSES  Final diagnoses:  Healthcare maintenance      NEW MEDICATIONS STARTED DURING THIS VISIT:  ED Discharge Orders    None          This chart was dictated using voice recognition software/Dragon. Despite best efforts to proofread, errors can occur which can change the meaning. Any change was purely unintentional.     Lannie Fields, PA-C 10/22/20 1128

## 2020-10-22 NOTE — Telephone Encounter (Cosign Needed)
Patient was notified of reassuring CBC and CMP.  Recommended continuing meclizine and Zyrtec as discussed to urgent care.  Return precautions were given to return with new or worsening symptoms.

## 2020-10-22 NOTE — Discharge Instructions (Addendum)
Take meclizine twice daily as needed for dizziness. Take Zyrtec once daily for the next seven days.

## 2020-10-22 NOTE — ED Triage Notes (Signed)
Pt c/o dizziness and lightheadedness off and on for the past 6 months. States dizziness is worse on position changes and turning her head. States has ringing in her ears at times. States yesterday she had a bad spell that lasted all day. States only slight dizziness today.

## 2020-11-27 ENCOUNTER — Other Ambulatory Visit: Payer: Self-pay | Admitting: Internal Medicine

## 2020-11-27 DIAGNOSIS — F429 Obsessive-compulsive disorder, unspecified: Secondary | ICD-10-CM

## 2021-04-24 ENCOUNTER — Other Ambulatory Visit: Payer: Self-pay | Admitting: Internal Medicine

## 2021-04-24 NOTE — Telephone Encounter (Signed)
In-person appt  has been scheduled on 05/06/21.

## 2021-04-24 NOTE — Telephone Encounter (Signed)
Last appt 07/2019. I called pt to schedule an appt - pt stated with covid and her age of 66 she prefers not to come in. Suggested scheduling a telehealth appt; she's agreeable. Call transferred to front office.

## 2021-05-06 ENCOUNTER — Other Ambulatory Visit: Payer: Self-pay

## 2021-05-06 ENCOUNTER — Encounter: Payer: Self-pay | Admitting: Student

## 2021-05-06 ENCOUNTER — Ambulatory Visit (INDEPENDENT_AMBULATORY_CARE_PROVIDER_SITE_OTHER): Payer: Medicare Other | Admitting: Student

## 2021-05-06 VITALS — BP 127/72 | HR 89 | Temp 98.0°F | Ht 65.0 in | Wt 140.6 lb

## 2021-05-06 DIAGNOSIS — I491 Atrial premature depolarization: Secondary | ICD-10-CM

## 2021-05-06 DIAGNOSIS — Z299 Encounter for prophylactic measures, unspecified: Secondary | ICD-10-CM | POA: Diagnosis not present

## 2021-05-06 DIAGNOSIS — G43009 Migraine without aura, not intractable, without status migrainosus: Secondary | ICD-10-CM | POA: Diagnosis not present

## 2021-05-06 DIAGNOSIS — N393 Stress incontinence (female) (male): Secondary | ICD-10-CM | POA: Diagnosis not present

## 2021-05-06 DIAGNOSIS — G3184 Mild cognitive impairment, so stated: Secondary | ICD-10-CM | POA: Diagnosis not present

## 2021-05-06 DIAGNOSIS — E559 Vitamin D deficiency, unspecified: Secondary | ICD-10-CM

## 2021-05-06 DIAGNOSIS — E875 Hyperkalemia: Secondary | ICD-10-CM | POA: Diagnosis not present

## 2021-05-06 DIAGNOSIS — N182 Chronic kidney disease, stage 2 (mild): Secondary | ICD-10-CM | POA: Diagnosis not present

## 2021-05-06 DIAGNOSIS — F429 Obsessive-compulsive disorder, unspecified: Secondary | ICD-10-CM

## 2021-05-06 DIAGNOSIS — M810 Age-related osteoporosis without current pathological fracture: Secondary | ICD-10-CM | POA: Diagnosis not present

## 2021-05-06 DIAGNOSIS — Z0000 Encounter for general adult medical examination without abnormal findings: Secondary | ICD-10-CM

## 2021-05-06 DIAGNOSIS — R350 Frequency of micturition: Secondary | ICD-10-CM

## 2021-05-06 LAB — POCT URINALYSIS DIPSTICK
Bilirubin, UA: NEGATIVE
Blood, UA: NEGATIVE
Glucose, UA: NEGATIVE
Ketones, UA: NEGATIVE
Nitrite, UA: NEGATIVE
Protein, UA: NEGATIVE
Spec Grav, UA: >=1.03 — AB (ref 1.010–1.025)
Urobilinogen, UA: 0.2 E.U./dL
pH, UA: 5.5 (ref 5.0–8.0)

## 2021-05-06 NOTE — Patient Instructions (Signed)
Thank you for allowing [place] to be a part of your care today, it was a pleasure seeing you. We discussed your nighttime urination, forgetfulness, osteoporosis, migraines, vitamin D deficiency  I am checking these labs: Comprehensive metabolic panel, vitamin D, urinalysis  I have made these changes to your medications: None  For your nighttime urination, avoid drinking water for about 2 hours prior to bedtime.  You can also try the pelvic floor exercises which I am including with your paperwork.  For your forgetfulness, it is at a level called "mild cognitive impairment".  This does not require any treatment or further work-up at this time, let us know if this starts getting worse.  For your Vitamin D deficiency, I am checking a vitamin D and calcium level, I will call with results.  Continue current medications, I will let you know if needed changes.  For your migraines, continue using the sumatriptan hand as needed.  Please follow up in 1 year or sooner if needed   Thank you, and please call the Internal Medicine Clinic at [phone] if you have any questions.  Best, Dr. Bridgett Larsson

## 2021-05-06 NOTE — Assessment & Plan Note (Signed)
Long history of CKD stage II, last labs were in October 2021 and demonstrated GFR of 84.  Hemoglobin also within normal limits.

## 2021-05-06 NOTE — Assessment & Plan Note (Addendum)
Patient had a mildly low vitamin D level in 2011, has been normal since then.  She is on vitamin D supplementation for comorbid osteoporosis.  - Repeat vitamin D level  Addendum: Vitamin D and calcium levels within normal limits. Continue Vitamin D supplementation

## 2021-05-06 NOTE — Assessment & Plan Note (Addendum)
Was diagnosed on screening DEXA in 2014 with T score of -3.6.  She is taking vitamin D/Calcium supplement for this.  Of note, repeat DEXA was ordered in 2018 but she decided not to complete this as she did not want to start bisphosphonates.  She also noticed to have history of Schatzki's ring with some lingering dysphagia, so bisphosphonates are not a good option for her.  She is also not interested in the daily injectable anabolic agent such as teriparatide.  She would be agreeable to starting denosumab which would be every 6 months.  - Repeat DEXA, consider adding denosumab

## 2021-05-06 NOTE — Assessment & Plan Note (Signed)
This problem is chronic and well controlled.  States that her headaches have been becoming less frequent over time.  Sometimes are triggered by bright light.  Sumatriptan resolves her headaches consistently, requires this less than once a week.  She is happy with how this medication is working, will defer migraine prophylaxis.  - Continue sumatriptan as needed

## 2021-05-06 NOTE — Assessment & Plan Note (Signed)
This is a chronic stable problem.  States it is well controlled on the current medication.  - Continue fluvoxamine

## 2021-05-06 NOTE — Assessment & Plan Note (Signed)
Patient and daughter note troubles with memory and word finding.  They are not impacting her ADLs at this time.  MoCA score of 24 today, compared to 26 in 2018.  It was 21-year prior to that.  - No further treatment or work-up at this time as she does not meet criteria for dementia - Monitor for progression of cognitive impairment

## 2021-05-06 NOTE — Assessment & Plan Note (Signed)
Patient complains of 3-4 episodes of urination per night.  Denies snoring, apneic episodes, morning headache, or morning fatigue so low suspicion for OSA.  She also endorses continence at times when laughing or sneezing.  Denies symptoms of urge incontinence.  She states that her bladder feels like it is emptying when she goes.  Urinalysis unremarkable except for mildly elevated specific gravity and trace leukocytes.  - Instructed to restrict water intake 2 hours before bedtime - Given instructions for Kegel exercises

## 2021-05-06 NOTE — Assessment & Plan Note (Signed)
-   COVID-vaccine status updated.  She will consider getting the second booster - We discussed colonoscopy.  She is quite healthy for 80 years old and I think it would be reasonable to get a colonoscopy.  She does not feel strongly either way.  We decided to defer this for now.

## 2021-05-06 NOTE — Progress Notes (Signed)
   CC: Migraines, mild cognitive impairment, nocturia, OCD, osteoporosis  HPI:  [name] is a 80 y.o. female with history as below presenting for follow-up on the above. Please refer to problem based charting for further details of assessment and plan of current problem and chronic medical conditions.  Past Medical History:  Diagnosis Date  . Allergic rhinitis   . Anxiety   . Basal cell carcinoma    left temple  . Blood in stool    normal colonoscopy 2007  . Chronic fatigue syndrome   . Hyperlipidemia 08/26/2014  . Migraine   . Multiple chemical sensitivity syndrome   . OCD (obsessive compulsive disorder)   . Osteoporosis   . Schatzki's ring 12/09/2011  . Vitamin D deficiency    Review of Systems:   Review of Systems  Constitutional: Negative for weight loss.  HENT: Positive for hearing loss.   Respiratory: Negative for shortness of breath.   Gastrointestinal: Negative for abdominal pain, blood in stool, nausea and vomiting.  Genitourinary: Negative for dysuria, frequency and urgency.       Positive nocturia, stress incontinence  Musculoskeletal: Positive for back pain. Negative for falls.  All other systems reviewed and are negative.    Physical Exam: Vitals:   05/06/21 1425  BP: 127/72  Pulse: 89  Temp: 98 F (36.7 C)  TempSrc: Oral  SpO2: 97%  Weight: 140 lb 9.6 oz (63.8 kg)  Height: 5\' 5"  (1.651 m)   Constitutional: no acute distress Head: atraumatic ENT: external ears normal Pulmonary: effort normal Abdominal: flat Skin: warm and dry Neurological: alert, no focal deficit Psychiatric: normal mood and affect  Assessment & Plan:   See Encounters Tab for problem based charting.  Patient discussed with Dr. Philipp Campbell

## 2021-05-07 ENCOUNTER — Other Ambulatory Visit (INDEPENDENT_AMBULATORY_CARE_PROVIDER_SITE_OTHER): Payer: Medicare Other

## 2021-05-07 ENCOUNTER — Ambulatory Visit (HOSPITAL_COMMUNITY)
Admission: RE | Admit: 2021-05-07 | Discharge: 2021-05-07 | Disposition: A | Discharge status: Home / Self Care | Payer: Medicare Other | Source: Ambulatory Visit | Attending: Internal Medicine | Admitting: Internal Medicine

## 2021-05-07 DIAGNOSIS — I491 Atrial premature depolarization: Secondary | ICD-10-CM | POA: Insufficient documentation

## 2021-05-07 DIAGNOSIS — E875 Hyperkalemia: Secondary | ICD-10-CM

## 2021-05-07 LAB — CMP14 + ANION GAP
ALT: 14 IU/L (ref 0–32)
AST: 21 IU/L (ref 0–40)
Albumin/Globulin Ratio: 1.9 (ref 1.2–2.2)
Albumin: 4.3 g/dL (ref 3.6–4.6)
Alkaline Phosphatase: 84 IU/L (ref 44–121)
Anion Gap: 11 mmol/L (ref 10.0–18.0)
BUN/Creatinine Ratio: 17 (ref 12–28)
BUN: 15 mg/dL (ref 8–27)
Bilirubin Total: <0.2 mg/dL (ref 0.0–1.2)
CO2: 26 mmol/L (ref 20–29)
Calcium: 9.7 mg/dL (ref 8.7–10.3)
Chloride: 103 mmol/L (ref 96–106)
Creatinine, Ser: 0.88 mg/dL (ref 0.57–1.00)
Globulin, Total: 2.3 g/dL (ref 1.5–4.5)
Glucose: 97 mg/dL (ref 65–99)
Potassium: 6 mmol/L — ABNORMAL HIGH (ref 3.5–5.2)
Sodium: 140 mmol/L (ref 134–144)
Total Protein: 6.6 g/dL (ref 6.0–8.5)
eGFR: 66 mL/min/{1.73_m2} (ref >59)

## 2021-05-07 LAB — BASIC METABOLIC PANEL
Anion gap: 4 — ABNORMAL LOW (ref 5–15)
BUN: 13 mg/dL (ref 8–23)
CO2: 30 mmol/L (ref 22–32)
Calcium: 9.4 mg/dL (ref 8.9–10.3)
Chloride: 105 mmol/L (ref 98–111)
Creatinine, Ser: 0.84 mg/dL (ref 0.44–1.00)
GFR, Estimated: >60 mL/min (ref >60)
Glucose, Bld: 111 mg/dL — ABNORMAL HIGH (ref 70–99)
Potassium: 4.9 mmol/L (ref 3.5–5.1)
Sodium: 139 mmol/L (ref 135–145)

## 2021-05-07 LAB — VITAMIN D 25 HYDROXY (VIT D DEFICIENCY, FRACTURES): Vit D, 25-Hydroxy: 34.6 ng/mL (ref 30.0–100.0)

## 2021-05-07 NOTE — Assessment & Plan Note (Signed)
Patient received an EKG for hypokalemia, her repeat BMP showed normal K so suspect that the original level of due to lab error. The EKG did not show signs concerning for hyperkalemia, but did demonstrate frequent PACs. She does not have known history of this, and denies any palpitations or chest pain. She was instructed to make appointment to follow this up next week. No known cardiac history.  - at next visit, consider repeat EKG, ordering echo, and referral to cardiology for Holter monitor

## 2021-05-07 NOTE — Assessment & Plan Note (Addendum)
K of 6.0 on CMP. Denies any palpitations or chest pain. No history of hyperkalemia, and no medications of disease that would predispose her to this. Suspect this is pseudohyperkalemia. Discussed with patient and she understands that we would like to urgently verify this lab result. She is unable to get transportation here today as her daughter is at work. Discussed that she can go to an Urgent Care after work to have labs drawn. She understands.   - patient will arrange transportation for repeat lab draw - given precautions to go to ED in case of palpitations or chest pain. She understands.  Addendum: spoke with daughter who will bring patient for lab draw tomorrow afternoon  Addendum: BMP with K of 4.9, so last value was likely due to lab error. Of note, she had significant amount of PACs on her EKG. Will follow up on appointment next week.

## 2021-05-07 NOTE — Addendum Note (Signed)
Addended by: Marcelino Duster on: 05/07/2021 11:31 AM   Modules accepted: Orders

## 2021-05-07 NOTE — Addendum Note (Signed)
Addended by: Andrew Au on: 05/07/2021 10:18 AM   Modules accepted: Orders

## 2021-05-09 NOTE — Progress Notes (Signed)
Internal Medicine Clinic Attending  Case discussed with Dr. Chen  At the time of the visit.  We reviewed the resident's history and exam and pertinent patient test results.  I agree with the assessment, diagnosis, and plan of care documented in the resident's note. 

## 2021-05-15 ENCOUNTER — Encounter: Payer: Self-pay | Admitting: Internal Medicine

## 2021-05-15 ENCOUNTER — Ambulatory Visit (INDEPENDENT_AMBULATORY_CARE_PROVIDER_SITE_OTHER): Payer: Medicare Other | Admitting: Internal Medicine

## 2021-05-15 VITALS — BP 132/86 | HR 66 | Temp 97.9°F | Ht 65.0 in | Wt 141.8 lb

## 2021-05-15 DIAGNOSIS — I491 Atrial premature depolarization: Secondary | ICD-10-CM

## 2021-05-15 DIAGNOSIS — J029 Acute pharyngitis, unspecified: Secondary | ICD-10-CM | POA: Diagnosis not present

## 2021-05-15 NOTE — Assessment & Plan Note (Signed)
Patient presents today for further work-up of PACs.  She has been asymptomatic.  Patient would like some further evaluation and will refer to cardiology for a Zio patch.  Personally reviewed EKG from May 11.   NSR , 76/min PR 150 Mms QRS 64 ms QTc nl for rate, normal axis. Premature atrial complexes -  Personally Reviewed

## 2021-05-15 NOTE — Progress Notes (Signed)
   CC: Sore throat and premature atrial contractions  HPI:[name] is a 80 y.o. female who presents for evaluation of sore throat and premature atrial contractions. Please see individual problem based A/P for details.   Past Medical History:  Diagnosis Date  . Allergic rhinitis   . Anxiety   . Basal cell carcinoma    left temple  . Blood in stool    normal colonoscopy 2007  . Chronic fatigue syndrome   . Hyperlipidemia 08/26/2014  . Migraine   . Multiple chemical sensitivity syndrome   . OCD (obsessive compulsive disorder)   . Osteoporosis   . Schatzki's ring 12/09/2011  . Vitamin D deficiency    Review of Systems:   Review of Systems  Constitutional: Negative for chills and fever.  HENT: Negative for sinus pain and sore throat.   Respiratory: Negative for stridor.   Cardiovascular: Negative for chest pain, palpitations, orthopnea, leg swelling and PND.     Physical Exam: Vitals:   05/15/21 1052  BP: 132/86  Pulse: 66  Temp: 97.9 F (36.6 C)  TempSrc: Oral  SpO2: 98%  Weight: 141 lb 12.8 oz (64.3 kg)  Height: 5\' 5"  (1.651 m)   General: Appears younger than stated age, NAD HEENT: Conjunctiva nl , antiicteric sclerae, moist mucous membranes, no exudate ,  Erythema of posterior oral mucosa Cardiovascular: Normal rate, regular rhythm.  No murmurs, rubs, or gallops Pulmonary : Equal breath sounds, No wheezes, rales, or rhonchi Abdominal: soft, nontender,  bowel sounds present Ext: No edema in lower extremities, no tenderness to palpation of lower extremities.   Assessment & Plan:   See Encounters Tab for problem based charting.  Patient discussed with Dr. Dareen Piano

## 2021-05-15 NOTE — Patient Instructions (Signed)
Thank you for trusting me with your care. To recap, today we discussed the following:   1. Premature atrial contractions  - Ambulatory referral to Cardiology  2. Sore throat  - Coronavirus (COVID-19) with Influenza A and Influenza B

## 2021-05-15 NOTE — Assessment & Plan Note (Addendum)
Patient reports feeling slightly fatigued and having a sore throat which started this morning.  No exudate, cough, lymphadenopathy, or adventitious breath sounds on exam.  Patient has allergies and expect postnasal drip . Also testing patient for COVID and Influenza.

## 2021-05-16 ENCOUNTER — Telehealth: Payer: Self-pay | Admitting: *Deleted

## 2021-05-16 LAB — COVID-19, FLU A+B NAA
Influenza A, NAA: NOT DETECTED
Influenza B, NAA: NOT DETECTED
SARS-CoV-2, NAA: NOT DETECTED

## 2021-05-16 NOTE — Telephone Encounter (Signed)
Attempted phone call and did not reach. Sent mychart message with negative results.

## 2021-05-16 NOTE — Telephone Encounter (Signed)
Patient called in for Covid results. Explained the test result is not back yet.

## 2021-05-19 ENCOUNTER — Other Ambulatory Visit: Payer: Self-pay | Admitting: *Deleted

## 2021-05-19 NOTE — Progress Notes (Signed)
Internal Medicine Clinic Attending  Case discussed with Dr. Steen  At the time of the visit.  We reviewed the resident's history and exam and pertinent patient test results.  I agree with the assessment, diagnosis, and plan of care documented in the resident's note.  

## 2021-05-20 ENCOUNTER — Ambulatory Visit: Payer: Medicare Other | Admitting: Cardiology

## 2021-06-06 ENCOUNTER — Encounter: Payer: Self-pay | Admitting: *Deleted

## 2021-07-01 ENCOUNTER — Encounter: Payer: Self-pay | Admitting: *Deleted

## 2021-07-19 NOTE — Progress Notes (Signed)
Cardiology Office Note:    Date:  07/22/2021   ID:  Shelly Campbell, DOB December 12, 1941, MRN [id number]  PCP:  Farrel Gordon, DO   [facility] Providers Cardiologist:  None {   Referring MD: Aldine Contes, MD    History of Present Illness:    Shelly Campbell is a 80 y.o. female with a hx of OCD, HLD, CKD stage II, and migraines who was referred by Sdr. Dareen Piano for further evaluation of PACs and chest tightness.  Today, the patient states she overall feels well. Has been having episodes of chest tightness with exertion with occasional SOB. Each episode lasts about a minute before resolving. Does not occur every time she exerts herself and does not believe it happens at rest. Overall, she is less active since the start of the pandemic but she still walks around her apartment complex frequently. Occasional lightheadedness mainly with changing positions, which is overall improved. No palpitations, orthopnea, PND, nausea or vomiting. Blood pressure is very well controlled not on medications.   Family History: Father with history of massive MI at age 76, Brother with MI in his 63s, son with CABG in 55s (obese).   Past Medical History:  Diagnosis Date   Allergic rhinitis    Anxiety    Basal cell carcinoma    left temple   Blood in stool    normal colonoscopy 2007   Chronic fatigue syndrome    Hyperlipidemia 08/26/2014   Migraine    Multiple chemical sensitivity syndrome    OCD (obsessive compulsive disorder)    Osteoporosis    Schatzki's ring 12/09/2011   Vitamin D deficiency     Past Surgical History:  Procedure Laterality Date   BREAST ENHANCEMENT SURGERY     bilat, 1973    Current Medications: Current Meds  Medication Sig   calcium-vitamin D (OSCAL 500/200 D-3) 500-200 MG-UNIT tablet Take 1 tablet by mouth 2 (two) times daily.   Cyanocobalamin (VITAMIN B-12) 1000 MCG/15ML LIQD Take 15 mLs (1,000 mcg total) by mouth daily.   fluvoxaMINE (LUVOX) 100 MG tablet TAKE 1  TABLET(100 MG) BY MOUTH TWICE DAILY   SUMAtriptan (IMITREX) 100 MG tablet TAKE 1 TABLET(100 MG) BY MOUTH EVERY 2 HOURS AS NEEDED FOR MIGRAINE. MAY REPEAT IN 2 HOURS IF HEADACHE PERSISTS OR RECURS     Allergies:   Patient has no known allergies.   Social History   Socioeconomic History   Marital status: Legally Separated    Spouse name: Not on file   Number of children: 2   Years of education: Not on file   Highest education level: Not on file  Occupational History   Occupation: Housekeeping  Tobacco Use   Smoking status: Never   Smokeless tobacco: Never  Substance and Sexual Activity   Alcohol use: No   Drug use: No   Sexual activity: Not on file  Other Topics Concern   Not on file  Social History Narrative   Patient is a Sales promotion account executive Witness.   Social Determinants of Health   Financial Resource Strain: Not on file  Food Insecurity: Not on file  Transportation Needs: Not on file  Physical Activity: Not on file  Stress: Not on file  Social Connections: Not on file     Family History: The patient's family history includes ALS in her father; Breast cancer in her mother; Colon polyps in her mother; Heart attack in her mother; Heart disease in her father.  ROS:   Please see the history of  present illness.    Review of Systems  Constitutional:  Negative for chills and fever.  HENT:  Negative for sore throat.   Eyes:  Negative for blurred vision.  Respiratory:  Positive for shortness of breath.   Cardiovascular:  Positive for chest pain. Negative for palpitations, orthopnea, claudication, leg swelling and PND.  Gastrointestinal:  Positive for heartburn. Negative for nausea and vomiting.  Genitourinary:  Negative for flank pain.  Musculoskeletal:  Negative for falls.  Neurological:  Positive for dizziness. Negative for loss of consciousness.  Psychiatric/Behavioral:  Negative for substance abuse.     EKGs/Labs/Other Studies Reviewed:    The following studies were  reviewed today: No cardiac studies  EKG:  EKG 05/07/21 NSR with PACs  Recent Labs: 10/22/2020: Hemoglobin 13.9; Platelets 184 05/06/2021: ALT 14 05/07/2021: BUN 13; Creatinine, Ser 0.84; Potassium 4.9; Sodium 139  Recent Lipid Panel    Component Value Date/Time   CHOL 192 07/20/2017 1615   TRIG 110 07/20/2017 1615   HDL 71 07/20/2017 1615   CHOLHDL 2.7 07/20/2017 1615   CHOLHDL 2.2 08/24/2014 1513   VLDL 29 08/24/2014 1513   LDLCALC 99 07/20/2017 1615     Risk Assessment/Calculations:           Physical Exam:    VS:  BP 108/70   Pulse 89   Ht '5\' 5"'$  (1.651 m)   Wt 136 lb (61.7 kg)   SpO2 95%   BMI 22.63 kg/m     Wt Readings from Last 3 Encounters:  07/22/21 136 lb (61.7 kg)  05/15/21 141 lb 12.8 oz (64.3 kg)  05/06/21 140 lb 9.6 oz (63.8 kg)     GEN:  Well nourished, well developed in no acute distress HEENT: Normal NECK: No JVD; No carotid bruits CARDIAC: RRR, no murmurs, rubs, gallops RESPIRATORY:  Clear to auscultation without rales, wheezing or rhonchi  ABDOMEN: Soft, non-tender, non-distended MUSCULOSKELETAL:  No edema; No deformity  SKIN: Warm and dry NEUROLOGIC:  Alert and oriented x 3 PSYCHIATRIC:  Normal affect   ASSESSMENT:    1. Precordial pain   2. Premature atrial contractions    PLAN:    In order of problems listed above:  #Chest Tightness: Patient has been having intermittent episodes of exertional chest tightness over the past several months. Has some associated dyspnea. Symptoms last only a minute or two before resolving. No lightheadedness, dizziness, palpitations or HF symptoms. Has strong family history of CAD, but no personal history of CVD. Given concern for stable angina, will pursue ischemic evaluation -Check exercise lexiscan -Check TTE  #PACs: Asymptomatic and no known history of Afib. Not on nodal agents and blood pressure is well controlled. Will continue to monitor at this time due to lack of symptoms. If TTE shows  significant atrial dilation, will plan for cardiac monitor to assess for Afib.    Shared Decision Making/Informed Consent The risks [chest pain, shortness of breath, cardiac arrhythmias, dizziness, blood pressure fluctuations, myocardial infarction, stroke/transient ischemic attack, nausea, vomiting, allergic reaction, radiation exposure, metallic taste sensation and life-threatening complications (estimated to be 1 in 10,000)], benefits (risk stratification, diagnosing coronary artery disease, treatment guidance) and alternatives of a nuclear stress test were discussed in detail with Shelly Campbell and she agrees to proceed.    Medication Adjustments/Labs and Tests Ordered: Current medicines are reviewed at length with the patient today.  Concerns regarding medicines are outlined above.  Orders Placed This Encounter  Procedures   MYOCARDIAL PERFUSION IMAGING   ECHOCARDIOGRAM COMPLETE  No orders of the defined types were placed in this encounter.   Patient Instructions  Medication Instructions:   Your physician recommends that you continue on your current medications as directed. Please refer to the Current Medication list given to you today.  *If you need a refill on your cardiac medications before your next appointment, please call your pharmacy*   Testing/Procedures:  Your physician has requested that you have an echocardiogram. Echocardiography is a painless test that uses sound waves to create images of your heart. It provides your doctor with information about the size and shape of your heart and how well your heart's chambers and valves are working. This procedure takes approximately one hour. There are no restrictions for this procedure.  Your physician has requested that you have en exercise stress myoview. For further information please visit [email or web address]. Please follow instruction sheet, as given.   Follow-Up: At [facility], you and your health needs are our  priority.  As part of our continuing mission to provide you with exceptional heart care, we have created designated Provider Care Teams.  These Care Teams include your primary Cardiologist (physician) and Advanced Practice Providers (APPs -  Physician Assistants and Nurse Practitioners) who all work together to provide you with the care you need, when you need it.  We recommend signing up for the patient portal called "MyChart".  Sign up information is provided on this After Visit Summary.  MyChart is used to connect with patients for Virtual Visits (Telemedicine).  Patients are able to view lab/test results, encounter notes, upcoming appointments, etc.  Non-urgent messages can be sent to your provider as well.   To learn more about what you can do with MyChart, go to [email or web address].    Your next appointment:   6 - 8 month(s)  The format for your next appointment:   In Person  Provider:   Gwyndolyn Kaufman, MD      Signed, Freada Bergeron, MD  07/22/2021 10:09 AM    [place]

## 2021-07-20 ENCOUNTER — Encounter: Payer: Self-pay | Admitting: *Deleted

## 2021-07-20 NOTE — Progress Notes (Signed)

## 2021-07-22 ENCOUNTER — Other Ambulatory Visit: Payer: Self-pay

## 2021-07-22 ENCOUNTER — Encounter: Payer: Self-pay | Admitting: *Deleted

## 2021-07-22 ENCOUNTER — Encounter: Payer: Self-pay | Admitting: Cardiology

## 2021-07-22 ENCOUNTER — Ambulatory Visit (INDEPENDENT_AMBULATORY_CARE_PROVIDER_SITE_OTHER): Payer: Medicare Other | Admitting: Cardiology

## 2021-07-22 VITALS — BP 108/70 | HR 89 | Ht 65.0 in | Wt 136.0 lb

## 2021-07-22 DIAGNOSIS — R072 Precordial pain: Secondary | ICD-10-CM

## 2021-07-22 DIAGNOSIS — I491 Atrial premature depolarization: Secondary | ICD-10-CM | POA: Diagnosis not present

## 2021-07-22 NOTE — Progress Notes (Signed)
Annual Wellness Visit   Medicare Covered Preventative Screenings and Services  Services & Screenings Men and Women Who How Often Need? Date of Last Service Action  Abdominal Aortic Aneurysm Adults with AAA risk factors Once     Alcohol Misuse and Counseling All Adults Screening once a year if no alcohol misuse. Counseling up to 4 face to face sessions.     Bone Density Measurement  Adults at risk for osteoporosis Once every 2 yrs X    Lipid Panel Z13.6 All adults without CV disease Once every 5 yrs     Colorectal Cancer  Stool sample or Colonoscopy All adults 61 and older  Once every year Every 10 years X    Depression All Adults Once a year  Today   Diabetes Screening Blood glucose, post glucose load, or GTT Z13.1 All adults at risk Pre-diabetics Once per year Twice per year     Diabetes  Self-Management Training All adults Diabetics 10 hrs first year; 2 hours subsequent years. Requires Copay     Glaucoma Diabetics Family history of glaucoma African Americans 9 yrs + Hispanic Americans 33 yrs + Annually - requires coppay     Hepatitis C Z72.89 or F19.20 High Risk for HCV Born between 1945 and 1965 Annually Once     HIV Z11.4 All adults based on risk Annually btw ages 66 & 22 regardless of risk Annually > 65 yrs if at increased risk     Lung Cancer Screening Asymptomatic adults aged 53-77 with 30 pack yr history and current smoker OR quit within the last 15 yrs Annually Must have counseling and shared decision making documentation before first screen     Medical Nutrition Therapy Adults with  Diabetes Renal disease Kidney transplant within past 3 yrs 3 hours first year; 2 hours subsequent years     Obesity and Counseling All adults Screening once a year Counseling if BMI 30 or higher  Today   Tobacco Use Counseling Adults who use tobacco  Up to 8 visits in one year     Vaccines Z23 Hepatitis B Influenza  Pneumonia  Adults  Once Once every flu season Two different  vaccines separated by one year     Next Annual Wellness Visit People with Medicare Every year  Today     Services & Screenings Women Who How Often Need  Date of Last Service Action  Mammogram  Z12.31 Women over 66 One baseline ages 67-39. Annually ager 30 yrs+ X    Pap tests All women Annually if high risk. Every 2 yrs for normal risk women     Screening for cervical cancer with  Pap (Z01.419 nl or Z01.411abnl) & HPV Z11.51 Women aged 7 to 58 Once every 5 yrs     Screening pelvic and breast exams All women Annually if high risk. Every 2 yrs for normal risk women     Sexually Transmitted Diseases Chlamydia Gonorrhea Syphilis All at risk adults Annually for non pregnant females at increased risk         [place] Men Who How Ofter Need  Date of Last Service Action  Prostate Cancer - DRE & PSA Men over 50 Annually.  DRE might require a copay.     Sexually Transmitted Diseases Syphilis All at risk adults Annually for men at increased risk         Things That May Be Affecting Your Health:  Alcohol X Hearing loss  Pain    Depression  Home  Safety  Sexual Health   Diabetes X Lack of physical activity  Stress   Difficulty with daily activities  Loneliness X Tiredness   Drug use  Medicines  Tobacco use   Falls  Motor Vehicle Safety  Weight   Food choices  Oral Health  Other    YOUR PERSONALIZED HEALTH PLAN : 1. Schedule your next subsequent Medicare Wellness visit in one year 2. Attend all of your regular appointments to address your medical issues 3. Complete the preventative screenings and services

## 2021-07-22 NOTE — Patient Instructions (Signed)
Medication Instructions:   Your physician recommends that you continue on your current medications as directed. Please refer to the Current Medication list given to you today.  *If you need a refill on your cardiac medications before your next appointment, please call your pharmacy*   Testing/Procedures:  Your physician has requested that you have an echocardiogram. Echocardiography is a painless test that uses sound waves to create images of your heart. It provides your doctor with information about the size and shape of your heart and how well your heart's chambers and valves are working. This procedure takes approximately one hour. There are no restrictions for this procedure.  Your physician has requested that you have en exercise stress myoview. For further information please visit [email or web address]. Please follow instruction sheet, as given.   Follow-Up: At [facility], you and your health needs are our priority.  As part of our continuing mission to provide you with exceptional heart care, we have created designated Provider Care Teams.  These Care Teams include your primary Cardiologist (physician) and Advanced Practice Providers (APPs -  Physician Assistants and Nurse Practitioners) who all work together to provide you with the care you need, when you need it.  We recommend signing up for the patient portal called "MyChart".  Sign up information is provided on this After Visit Summary.  MyChart is used to connect with patients for Virtual Visits (Telemedicine).  Patients are able to view lab/test results, encounter notes, upcoming appointments, etc.  Non-urgent messages can be sent to your provider as well.   To learn more about what you can do with MyChart, go to [email or web address].    Your next appointment:   6 - 8 month(s)  The format for your next appointment:   In Person  Provider:   Gwyndolyn Kaufman, MD

## 2021-07-24 ENCOUNTER — Other Ambulatory Visit: Payer: Self-pay | Admitting: *Deleted

## 2021-07-24 DIAGNOSIS — F429 Obsessive-compulsive disorder, unspecified: Secondary | ICD-10-CM

## 2021-07-25 MED ORDER — FLUVOXAMINE MALEATE 100 MG PO TABS: ORAL_TABLET | ORAL | 0 refills | Status: DC

## 2021-08-05 ENCOUNTER — Telehealth (HOSPITAL_COMMUNITY): Payer: Self-pay | Admitting: Radiology

## 2021-08-05 ENCOUNTER — Telehealth (HOSPITAL_COMMUNITY): Payer: Self-pay | Admitting: *Deleted

## 2021-08-05 NOTE — Telephone Encounter (Signed)
Patient given detailed instructions per Myocardial Perfusion Study Information Sheet for the test on 08/12/2021 at 8:00. Patient notified to arrive 15 minutes early and that it is imperative to arrive on time for appointment to keep from having the test rescheduled.  If you need to cancel or reschedule your appointment, please call the office within 24 hours of your appointment. . Patient verbalized understanding.EHK

## 2021-08-05 NOTE — Telephone Encounter (Signed)
Left message on voicemail in reference to upcoming appointment scheduled for  8/16/22Phone number given for a call back so details instructions can be given. Kirstie Peri, RN

## 2021-08-11 ENCOUNTER — Telehealth (HOSPITAL_COMMUNITY): Payer: Self-pay | Admitting: Cardiology

## 2021-08-11 NOTE — Telephone Encounter (Signed)
Patient called and cancelled Myoview and does not wish to reschedule at this time. She will call us back when she is ready to do so. Order will be removed form the [place] and when she calls back we will reinstate the order.

## 2021-08-12 ENCOUNTER — Encounter (HOSPITAL_COMMUNITY): Payer: Medicare Other

## 2021-08-12 ENCOUNTER — Other Ambulatory Visit (HOSPITAL_COMMUNITY): Payer: Medicare Other

## 2021-08-21 ENCOUNTER — Telehealth (HOSPITAL_COMMUNITY): Payer: Self-pay | Admitting: Cardiology

## 2021-08-21 NOTE — Telephone Encounter (Signed)
Patient called and cancelled echocardiogram and did not wish to reschedule at this time. Order will be removed from the ECHO [facility] and if patient calls back to reschedule we will reinstate the order. Thank you

## 2021-08-29 ENCOUNTER — Other Ambulatory Visit (HOSPITAL_COMMUNITY): Payer: Medicare Other

## 2021-10-28 ENCOUNTER — Other Ambulatory Visit: Payer: Self-pay

## 2021-10-28 DIAGNOSIS — F429 Obsessive-compulsive disorder, unspecified: Secondary | ICD-10-CM

## 2021-10-28 MED ORDER — FLUVOXAMINE MALEATE 100 MG PO TABS: ORAL_TABLET | ORAL | 0 refills | Status: DC

## 2021-11-11 ENCOUNTER — Other Ambulatory Visit: Payer: Self-pay

## 2021-11-11 ENCOUNTER — Ambulatory Visit (INDEPENDENT_AMBULATORY_CARE_PROVIDER_SITE_OTHER): Payer: Medicare (Managed Care) | Admitting: Family Medicine

## 2021-11-11 ENCOUNTER — Encounter: Payer: Self-pay | Admitting: Family Medicine

## 2021-11-11 VITALS — BP 114/68 | HR 94 | Temp 97.1°F | Ht 65.0 in | Wt 140.6 lb

## 2021-11-11 DIAGNOSIS — G43009 Migraine without aura, not intractable, without status migrainosus: Secondary | ICD-10-CM

## 2021-11-11 DIAGNOSIS — F429 Obsessive-compulsive disorder, unspecified: Secondary | ICD-10-CM | POA: Diagnosis not present

## 2021-11-11 NOTE — Progress Notes (Signed)
[facility] [address] [place] [address] Dept: [phone] Dept Fax: [phone]  New Patient Office Visit  Subjective:    Patient ID: Shelly Campbell, female    DOB: 26-Jan-1941, 80 y.o..   MRN: [id number]  Chief Complaint  Patient presents with   Establish Care    NP-establish care.  No concerns.       History of Present Illness:  Patient is in today to establish care. Shelly Campbell was born in [place]. she has lived in [place] all of her life. She did attend [employer], taking business classes for about a year. She was separated from her husband for many years (he is now deceased). She has two daughters. She is currently living in a retirement community. She denies tobacco, alcohol, or drug use. Shelly Campbell is a Jehovah's Witness.  Shelly Campbell has a history of osteoporosis. She does take a daily calcium/vitamin D supplement. She notes she never wanted to pursue other treatment for this condition. She is scheduled for an upcoming DXA scan.  Shelly Campbell has a history of obsessive-compulsive disorder. This primarily exhibited itself as fear of germs and obsessiveness about avoidance of infection. This was significantly impacting the relationship with her family. She was eventually started on fluvoxamine (Luvox). This has dramatically improved the issue for her.  Shelly Campbell has a history of migraine headaches. She notes these do not occur as frequently as in the past. She uses Imitrex for treating these whent hey occur.  Shelly Campbell has issue with stress incontinence and nocturai. She is considering using pads to help with leaking.  Past Medical History: Patient Active Problem List   Diagnosis Date Noted   Hyperkalemia 05/07/2021   Premature atrial contractions 05/07/2021   Stress incontinence with nocturia 05/06/2021   History of basal cell cancer 07/20/2017   Mild cognitive impairment  07/17/2016   Chronic kidney disease (CKD), stage II (mild) 08/16/2015   Vitamin D insufficiency 08/26/2014   Hyperlipidemia 08/26/2014   Chronic constipation 08/26/2014   Osteoporosis 03/14/2009   Obsessive-compulsive disorder 04/06/2007   Migraine headache without aura 04/06/2007   Past Surgical History:  Procedure Laterality Date   BREAST ENHANCEMENT SURGERY     bilat, 1973   Family History  Problem Relation Age of Onset   Colon polyps Mother    Heart attack Mother    Breast cancer Mother    ALS Father    Heart disease Father    Hypertension Brother    Heart disease Brother    Outpatient Medications Prior to Visit  Medication Sig Dispense Refill   calcium-vitamin D (OSCAL 500/200 D-3) 500-200 MG-UNIT tablet Take 1 tablet by mouth 2 (two) times daily. 180 tablet 3   Cyanocobalamin (VITAMIN B-12) 1000 MCG/15ML LIQD Take 15 mLs (1,000 mcg total) by mouth daily. 450 mL 5   fluvoxaMINE (LUVOX) 100 MG tablet TAKE 1 TABLET(100 MG) BY MOUTH TWICE DAILY 180 tablet 0   polyethylene glycol powder (GLYCOLAX/MIRALAX) 17 GM/SCOOP powder DISSOLVE 17 GRAMS IN 8 OUNCES OF WATER AND DRINK BY MOUTH ONCE DAILY 510 g 1   SUMAtriptan (IMITREX) 100 MG tablet TAKE 1 TABLET(100 MG) BY MOUTH EVERY 2 HOURS AS NEEDED FOR MIGRAINE. MAY REPEAT IN 2 HOURS IF HEADACHE PERSISTS OR RECURS 12 tablet 3   cetirizine HCl (ZYRTEC) 1 MG/ML solution Take 5 mLs (5 mg total) by mouth daily for 10 days. (Patient not taking: Reported on 07/22/2021) 150 mL 0  No facility-administered medications prior to visit.   No Known Allergies    Objective:   Today's Vitals   11/11/21 1015  BP: 114/68  Pulse: 94  Temp: (!) 97.1 F (36.2 C)  TempSrc: Temporal  SpO2: 97%  Weight: 140 lb 9.6 oz (63.8 kg)  Height: 5\' 5"  (1.651 m)   Body mass index is 23.4 kg/m.   General: Well developed, well nourished. No acute distress. Psych: Alert and oriented. Normal mood and affect.  Health Maintenance Due  Topic Date Due    Zoster Vaccines- Shingrix (1 of 2) Never done   COVID-19 Vaccine (3 - Pfizer risk series) 08/14/2020   Lab Results Lab Results  Component Value Date   CHOL 192 07/20/2017   HDL 71 07/20/2017   LDLCALC 99 07/20/2017   TRIG 110 07/20/2017   CHOLHDL 2.7 07/20/2017   BMP Latest Ref Rng & Units 05/07/2021 05/06/2021 10/22/2020  Glucose 70 - 99 mg/dL 111(H) 97 94  BUN 8 - 23 mg/dL 13 15 14   Creatinine 0.44 - 1.00 mg/dL 0.84 0.88 0.63  BUN/Creat Ratio 12 - 28 - 17 22  Sodium 135 - 145 mmol/L 139 140 141  Potassium 3.5 - 5.1 mmol/L 4.9 6.0(H) 3.9  Chloride 98 - 111 mmol/L 105 103 106  CO2 22 - 32 mmol/L 30 26 27   Calcium 8.9 - 10.3 mg/dL 9.4 9.7 9.2   Component Ref Range & Units 6 mo ago 7 yr ago 8 yr ago 11 yr ago  Vit D, 25-Hydroxy 30.0 - 100.0 ng/mL 34.6  49 R, CM  57 R, CM  28 Low  R, CM    Assessment & Plan:   1. Obsessive-compulsive disorder, unspecified type This is apparently doing well on Luvox. I will plan to continue this long-term.  2. Migraine without aura and without status migrainosus, not intractable Using Imitrex for occasional abortive therapy.  Haydee Salter, MD

## 2021-11-13 ENCOUNTER — Other Ambulatory Visit: Payer: Medicare Other

## 2021-12-16 ENCOUNTER — Ambulatory Visit: Payer: Medicare Other | Admitting: Family Medicine

## 2021-12-24 ENCOUNTER — Ambulatory Visit (INDEPENDENT_AMBULATORY_CARE_PROVIDER_SITE_OTHER): Payer: Medicare (Managed Care) | Admitting: Family Medicine

## 2021-12-24 ENCOUNTER — Other Ambulatory Visit: Payer: Self-pay

## 2021-12-24 VITALS — BP 116/72 | HR 76 | Temp 97.5°F | Ht 65.0 in | Wt 139.2 lb

## 2021-12-24 DIAGNOSIS — N3000 Acute cystitis without hematuria: Secondary | ICD-10-CM | POA: Diagnosis not present

## 2021-12-24 LAB — POCT URINALYSIS DIPSTICK
Blood, UA: NEGATIVE
Glucose, UA: NEGATIVE
Nitrite, UA: NEGATIVE
Protein, UA: POSITIVE — AB
Spec Grav, UA: >=1.03 — AB (ref 1.010–1.025)
Urobilinogen, UA: NEGATIVE E.U./dL — AB
pH, UA: 5 (ref 5.0–8.0)

## 2021-12-24 MED ORDER — NITROFURANTOIN 25 MG/5ML PO SUSP: 50.0000 mg | Freq: Four times a day (QID) | ORAL | 0 refills | Status: DC

## 2021-12-24 NOTE — Progress Notes (Signed)
[facility] [address] [place] [address] Dept: [phone] Dept Fax: [phone]  Office Visit  Subjective:    Patient ID: [name], female    DOB: February 08, 1941, 80 y.o..   MRN: [id number]  Chief Complaint  Patient presents with   Acute Visit    C/o having possible UTI. Pressure in her bladder. Seen blood in urine 2 days.      History of Present Illness:  Patient is in today complaining of symptoms over the past month of urinary discomfort, frequency, and nocturia (up to 4 times a night). She noted some blood in her urine 2 days ago. She has not been feverish and is otherwise eating and drinking normally.  Past Medical History: Patient Active Problem List   Diagnosis Date Noted   Premature atrial contractions 05/07/2021   Stress incontinence with nocturia 05/06/2021   History of basal cell cancer 07/20/2017   Mild cognitive impairment 07/17/2016   Chronic kidney disease (CKD), stage II (mild) 08/16/2015   Chronic constipation 08/26/2014   Osteoporosis 03/14/2009   Obsessive-compulsive disorder 04/06/2007   Migraine headache without aura 04/06/2007   Past Surgical History:  Procedure Laterality Date   BREAST ENHANCEMENT SURGERY     bilat, 1973   Family History  Problem Relation Age of Onset   Colon polyps Mother    Heart attack Mother    Breast cancer Mother    ALS Father    Heart disease Father    Hypertension Brother    Heart disease Brother    Outpatient Medications Prior to Visit  Medication Sig Dispense Refill   Cyanocobalamin (VITAMIN B-12) 1000 MCG/15ML LIQD Take 15 mLs (1,000 mcg total) by mouth daily. 450 mL 5   fluvoxaMINE (LUVOX) 100 MG tablet TAKE 1 TABLET(100 MG) BY MOUTH TWICE DAILY 180 tablet 0   polyethylene glycol powder (GLYCOLAX/MIRALAX) 17 GM/SCOOP powder DISSOLVE 17 GRAMS IN 8 OUNCES OF WATER AND DRINK BY MOUTH ONCE DAILY 510 g 1   SUMAtriptan (IMITREX) 100 MG tablet TAKE 1  TABLET(100 MG) BY MOUTH EVERY 2 HOURS AS NEEDED FOR MIGRAINE. MAY REPEAT IN 2 HOURS IF HEADACHE PERSISTS OR RECURS 12 tablet 3   calcium-vitamin D (OSCAL 500/200 D-3) 500-200 MG-UNIT tablet Take 1 tablet by mouth 2 (two) times daily. 180 tablet 3   No facility-administered medications prior to visit.   No Known Allergies    Objective:   Today's Vitals   12/24/21 1049  BP: 116/72  Pulse: 76  Temp: (!) 97.5 F (36.4 C)  TempSrc: Temporal  SpO2: 97%  Weight: 139 lb 3.2 oz (63.1 kg)  Height: 5\' 5"  (1.651 m)   Body mass index is 23.16 kg/m.   General: Well developed, well nourished. No acute distress. Psych: Alert and oriented. Normal mood and affect.  Health Maintenance Due  Topic Date Due   Zoster Vaccines- Shingrix (1 of 2) Never done   COVID-19 Vaccine (3 - Pfizer risk series) 08/14/2020   Lab Results Urine dipstick shows negative for nitrites, red blood cells, glucose, urobilinogen, positive for leukocytes, protein, ketones.     Assessment & Plan:   1. Acute cystitis without hematuria In light of her dysuria and leukocyte positive urine, I will treat her empirically with nitrofurantoin. She has difficulty wallowing pills, so will prescribe a liquid. If symptoms are not improved after a week,s he should return for further evaluation.  - POCT Urinalysis Dipstick - nitrofurantoin (FURADANTIN) 25 MG/5ML suspension; Take 10 mLs (  50 mg total) by mouth 4 (four) times daily.  Dispense: 200 mL; Refill: 0 - Urine Culture  Haydee Salter, MD

## 2021-12-25 ENCOUNTER — Ambulatory Visit: Payer: Medicare (Managed Care) | Admitting: Family Medicine

## 2021-12-25 LAB — URINE CULTURE
MICRO NUMBER:: 12804283
Result:: NO GROWTH
SPECIMEN QUALITY:: ADEQUATE

## 2022-01-22 ENCOUNTER — Telehealth: Payer: Self-pay | Admitting: Family Medicine

## 2022-01-22 NOTE — Telephone Encounter (Signed)
Left message for patient's daughter, Otila Kluver,  to call back and schedule Medicare Annual Wellness Visit (AWV) in office.   If not able to come in office, please offer to do virtually or by telephone.  Left office number and my jabber [phone].  Due for AWVI  Please schedule at anytime with Nurse Health Advisor.

## 2022-01-27 ENCOUNTER — Ambulatory Visit: Payer: Medicare Other | Admitting: Cardiology

## 2022-01-30 DIAGNOSIS — K120 Recurrent oral aphthae: Secondary | ICD-10-CM | POA: Diagnosis not present

## 2022-02-13 ENCOUNTER — Telehealth: Payer: Self-pay | Admitting: Family Medicine

## 2022-02-13 NOTE — Telephone Encounter (Signed)
Pt is wanting a call back concerning something very confidential about herself. Please advise at [phone]

## 2022-02-13 NOTE — Telephone Encounter (Signed)
Patient called and states that she had a Breast implants (1973),  she didn't want to say anything in front of  her daughter due to she doesn't know.  Dm/cma

## 2022-04-08 ENCOUNTER — Ambulatory Visit (INDEPENDENT_AMBULATORY_CARE_PROVIDER_SITE_OTHER): Payer: Medicare (Managed Care)

## 2022-04-08 DIAGNOSIS — Z0000 Encounter for general adult medical examination without abnormal findings: Secondary | ICD-10-CM

## 2022-04-08 NOTE — Progress Notes (Signed)
? ?Subjective:  ? Shelly Campbell is a 81 y.o. female who presents for Medicare Annual (Subsequent) preventive examination. ? ?I connected with Shelly Campbell  today by telephone and verified that I am speaking with the correct person using two identifiers. ?Location patient: home ?Location provider: work ?Persons participating in the virtual visit: patient, provider. ?  ?I discussed the limitations, risks, security and privacy concerns of performing an evaluation and management service by telephone and the availability of in person appointments. I also discussed with the patient that there may be a patient responsible charge related to this service. The patient expressed understanding and verbally consented to this telephonic visit.  ?  ?Interactive audio and video telecommunications were attempted between this provider and patient, however failed, due to patient having technical difficulties OR patient did not have access to video capability.  We continued and completed visit with audio only. ? ?  ?Review of Systems    ? ?Cardiac Risk Factors include: advanced age (>36mn, >>75women) ? ?   ?Objective:  ?  ?Today's Vitals  ? ?There is no height or weight on file to calculate BMI. ? ? ?  04/08/2022  ? 11:18 AM 05/06/2021  ?  2:33 PM 12/30/2018  ?  2:33 PM 11/16/2018  ?  9:39 AM 07/22/2018  ?  2:29 PM 07/20/2017  ?  3:42 PM 07/14/2016  ?  1:55 PM  ?Advanced Directives  ?Does Patient Have a Medical Advance Directive? Yes Yes No No No No No  ?Type of [name]       ?Does patient want to make changes to medical advance directive?       Yes - information given  ?[place]? Yes - validated most recent copy scanned in chart (See row information) No - copy requested       ?Would patient like information on creating a medical advance directive?  No - Patient declined No - Patient declined Yes  (MAU/Ambulatory/Procedural Areas - Information given) No - Patient declined Yes (MAU/Ambulatory/Procedural Areas - Information given)   ? ? ?Current Medications (verified) ?Outpatient Encounter Medications as of 04/08/2022  ?Medication Sig  ? Cyanocobalamin (VITAMIN B-12) 1000 MCG/15ML LIQD Take 15 mLs (1,000 mcg total) by mouth daily.  ? fluvoxaMINE (LUVOX) 100 MG tablet TAKE 1 TABLET(100 MG) BY MOUTH TWICE DAILY  ? nitrofurantoin (FURADANTIN) 25 MG/5ML suspension Take 10 mLs (50 mg total) by mouth 4 (four) times daily.  ? polyethylene glycol powder (GLYCOLAX/MIRALAX) 17 GM/SCOOP powder DISSOLVE 17 GRAMS IN 8 OUNCES OF WATER AND DRINK BY MOUTH ONCE DAILY  ? SUMAtriptan (IMITREX) 100 MG tablet TAKE 1 TABLET(100 MG) BY MOUTH EVERY 2 HOURS AS NEEDED FOR MIGRAINE. MAY REPEAT IN 2 HOURS IF HEADACHE PERSISTS OR RECURS  ? calcium-vitamin D (OSCAL 500/200 D-3) 500-200 MG-UNIT tablet Take 1 tablet by mouth 2 (two) times daily.  ? ?No facility-administered encounter medications on file as of 04/08/2022.  ? ? ?Allergies (verified) ?Patient has no known allergies.  ? ?History: ?Past Medical History:  ?Diagnosis Date  ? Allergic rhinitis   ? Anxiety   ? Basal cell carcinoma   ? left temple  ? Blood in stool   ? normal colonoscopy 2007  ? Chronic fatigue syndrome   ? Hyperlipidemia 08/26/2014  ? Migraine   ? Multiple chemical sensitivity syndrome   ? OCD (obsessive compulsive disorder)   ? Osteoporosis   ?  Schatzki's ring 12/09/2011  ? Vitamin D deficiency   ? ?Past Surgical History:  ?Procedure Laterality Date  ? BREAST ENHANCEMENT SURGERY    ? bilat, 1973  ? ?Family History  ?Problem Relation Age of Onset  ? Colon polyps Mother   ? Heart attack Mother   ? Breast cancer Mother   ? ALS Father   ? Heart disease Father   ? Hypertension Brother   ? Heart disease Brother   ? ?Social History  ? ?Socioeconomic History  ? Marital status: Legally Separated  ?  Spouse name: Not on file  ? Number of children: 2  ? Years of education: Not on  file  ? Highest education level: Not on file  ?Occupational History  ? Occupation: Housekeeping  ?Tobacco Use  ? Smoking status: Never  ? Smokeless tobacco: Never  ?Substance and Sexual Activity  ? Alcohol use: No  ? Drug use: No  ? Sexual activity: Not on file  ?Other Topics Concern  ? Not on file  ?Social History Narrative  ? Patient is a Restaurant manager, fast food.  ? ?Social Determinants of Health  ? ?Financial Resource Strain: Low Risk   ? Difficulty of Paying Living Expenses: Not hard at all  ?Food Insecurity: No Food Insecurity  ? Worried About Charity fundraiser in the Last Year: Never true  ? Ran Out of Food in the Last Year: Never true  ?Transportation Needs: No Transportation Needs  ? Lack of Transportation (Medical): No  ? Lack of Transportation (Non-Medical): No  ?Physical Activity: Insufficiently Active  ? Days of Exercise per Week: 3 days  ? Minutes of Exercise per Session: 30 min  ?Stress: No Stress Concern Present  ? Feeling of Stress : Not at all  ?Social Connections: Moderately Isolated  ? Frequency of Communication with Friends and Family: Three times a week  ? Frequency of Social Gatherings with Friends and Family: Three times a week  ? Attends Religious Services: More than 4 times per year  ? Active Member of Clubs or Organizations: No  ? Attends Archivist Meetings: Never  ? Marital Status: Widowed  ? ? ?Tobacco Counseling ?Counseling given: Not Answered ? ? ?Clinical Intake: ? ?Pre-visit preparation completed: Yes ? ?Pain : No/denies pain ? ?  ? ?Nutritional Risks: None ?Diabetes: No ? ?How often do you need to have someone help you when you read instructions, pamphlets, or other written materials from your doctor or pharmacy?: 1 - Never ?What is the last grade level you completed in school?: college ? ?Diabetic?no  ? ?Interpreter Needed?: No ? ?Information entered by :: [email or web address] ? ? ?Activities of Daily Living ? ?  04/08/2022  ? 11:20 AM 05/06/2021  ?  2:32 PM  ?In your present state of  health, do you have any difficulty performing the following activities:  ?Hearing? 0 0  ?Vision? 0 1  ?Comment  needs new glasses.  Catract surgery  ?Difficulty concentrating or making decisions? 0 0  ?Walking or climbing stairs? 0 1  ?Comment  back pain  ?Dressing or bathing? 0 0  ?Doing errands, shopping? 0 1  ?Comment  daughter  ?Preparing Food and eating ? N   ?Using the Toilet? N   ?In the past six months, have you accidently leaked urine? N   ?Do you have problems with loss of bowel control? N   ?Managing your Medications? N   ?Managing your Finances? N   ?Housekeeping or managing your Housekeeping? N   ? ? ?  Patient Care Team: ?Haydee Salter, MD as PCP - General (Family Medicine) ?Freada Bergeron, MD as Consulting Physician (Cardiology) ? ?Indicate any recent Medical Services you may have received from other than [facility] providers in the past year (date may be approximate). ? ?   ?Assessment:  ? This is a routine wellness examination for B[place]. ? ?Hearing/Vision screen ?Vision Screening - Comments:: Annul eye exams wear glasses  ? ?Dietary issues and exercise activities discussed: ?Current Exercise Habits: Home exercise routine, Type of exercise: walking, Time (Minutes): 30, Frequency (Times/Week): 3, Weekly Exercise (Minutes/Week): 90, Intensity: Mild, Exercise limited by: orthopedic condition(s) ? ? Goals Addressed   ?None ?  ? ?Depression Screen ? ?  04/08/2022  ? 11:20 AM 04/08/2022  ? 11:17 AM 05/06/2021  ?  2:35 PM 12/30/2018  ?  2:36 PM 11/16/2018  ?  9:43 AM 07/22/2018  ?  2:35 PM 07/14/2016  ?  1:55 PM  ?PHQ 2/9 Scores  ?PHQ - 2 Score 0 0 0 0 0 0 0  ?PHQ- 9 Score    3 3    ?  ?Fall Risk ? ?  04/08/2022  ? 11:19 AM 11/11/2021  ? 10:13 AM 12/30/2018  ?  2:32 PM 11/16/2018  ?  9:38 AM 07/22/2018  ?  2:35 PM  ?Fall Risk   ?Falls in the past year? 0 0 0 0 No  ?Number falls in past yr: 0 0  0   ?Injury with Fall? 0 0     ?Risk for fall due to :  No Fall Risks     ?Follow up Falls evaluation completed       ? ? ?FALL RISK PREVENTION PERTAINING TO THE HOME: ? ?Any stairs in or around the home? No  ?If so, are there any without handrails? No  ?Home free of loose throw rugs in walkways, pet beds, electrical cords, etc? Yes

## 2022-04-08 NOTE — Patient Instructions (Signed)
Ms. Yerian , ?Thank you for taking time to come for your Medicare Wellness Visit. I appreciate your ongoing commitment to your health goals. Please review the following plan we discussed and let me know if I can assist you in the future.  ? ?Screening recommendations/referrals: ?Colonoscopy: no longer required  ?Mammogram: no longer required  ?Bone Density: 07/28/2013 ?Recommended yearly ophthalmology/optometry visit for glaucoma screening and checkup ?Recommended yearly dental visit for hygiene and checkup ? ?Vaccinations: ?Influenza vaccine: completed  ?Pneumococcal vaccine: completed  ?Tdap vaccine: 08/11/2013 ?Shingles vaccine: will consider    ? ?Advanced directives: yes  ? ?Conditions/risks identified: none  ? ?Next appointment: none  ? ? ?Preventive Care 3 Years and Older, Female ?Preventive care refers to lifestyle choices and visits with your health care provider that can promote health and wellness. ?What does preventive care include? ?A yearly physical exam. This is also called an annual well check. ?Dental exams once or twice a year. ?Routine eye exams. Ask your health care provider how often you should have your eyes checked. ?Personal lifestyle choices, including: ?Daily care of your teeth and gums. ?Regular physical activity. ?Eating a healthy diet. ?Avoiding tobacco and drug use. ?Limiting alcohol use. ?Practicing safe sex. ?Taking low-dose aspirin every day. ?Taking vitamin and mineral supplements as recommended by your health care provider. ?What happens during an annual well check? ?The services and screenings done by your health care provider during your annual well check will depend on your age, overall health, lifestyle risk factors, and family history of disease. ?Counseling  ?Your health care provider may ask you questions about your: ?Alcohol use. ?Tobacco use. ?Drug use. ?Emotional well-being. ?Home and relationship well-being. ?Sexual activity. ?Eating habits. ?History of falls. ?Memory  and ability to understand (cognition). ?Work and work Statistician. ?Reproductive health. ?Screening  ?You may have the following tests or measurements: ?Height, weight, and BMI. ?Blood pressure. ?Lipid and cholesterol levels. These may be checked every 5 years, or more frequently if you are over 25 years old. ?Skin check. ?Lung cancer screening. You may have this screening every year starting at age 39 if you have a 30-pack-year history of smoking and currently smoke or have quit within the past 15 years. ?Fecal occult blood test (FOBT) of the stool. You may have this test every year starting at age 79. ?Flexible sigmoidoscopy or colonoscopy. You may have a sigmoidoscopy every 5 years or a colonoscopy every 10 years starting at age 70. ?Hepatitis C blood test. ?Hepatitis B blood test. ?Sexually transmitted disease (STD) testing. ?Diabetes screening. This is done by checking your blood sugar (glucose) after you have not eaten for a while (fasting). You may have this done every 1-3 years. ?Bone density scan. This is done to screen for osteoporosis. You may have this done starting at age 59. ?Mammogram. This may be done every 1-2 years. Talk to your health care provider about how often you should have regular mammograms. ?Talk with your health care provider about your test results, treatment options, and if necessary, the need for more tests. ?Vaccines  ?Your health care provider may recommend certain vaccines, such as: ?Influenza vaccine. This is recommended every year. ?Tetanus, diphtheria, and acellular pertussis (Tdap, Td) vaccine. You may need a Td booster every 10 years. ?Zoster vaccine. You may need this after age 82. ?Pneumococcal 13-valent conjugate (PCV13) vaccine. One dose is recommended after age 60. ?Pneumococcal polysaccharide (PPSV23) vaccine. One dose is recommended after age 53. ?Talk to your health care provider about which screenings and  vaccines you need and how often you need them. ?This  information is not intended to replace advice given to you by your health care provider. Make sure you discuss any questions you have with your health care provider. ?Document Released: 01/10/2016 Document Revised: 09/02/2016 Document Reviewed: 10/15/2015 ?Elsevier Interactive Patient Education ? 2017 [place]. ? ?Fall Prevention in the Home ?Falls can cause injuries. They can happen to people of all ages. There are many things you can do to make your home safe and to help prevent falls. ?What can I do on the outside of my home? ?Regularly fix the edges of walkways and driveways and fix any cracks. ?Remove anything that might make you trip as you walk through a door, such as a raised step or threshold. ?Trim any bushes or trees on the path to your home. ?Use bright outdoor lighting. ?Clear any walking paths of anything that might make someone trip, such as rocks or tools. ?Regularly check to see if handrails are loose or broken. Make sure that both sides of any steps have handrails. ?Any raised decks and porches should have guardrails on the edges. ?Have any leaves, snow, or ice cleared regularly. ?Use sand or salt on walking paths during winter. ?Clean up any spills in your garage right away. This includes oil or grease spills. ?What can I do in the bathroom? ?Use night lights. ?Install grab bars by the toilet and in the tub and shower. Do not use towel bars as grab bars. ?Use non-skid mats or decals in the tub or shower. ?If you need to sit down in the shower, use a plastic, non-slip stool. ?Keep the floor dry. Clean up any water that spills on the floor as soon as it happens. ?Remove soap buildup in the tub or shower regularly. ?Attach bath mats securely with double-sided non-slip rug tape. ?Do not have throw rugs and other things on the floor that can make you trip. ?What can I do in the bedroom? ?Use night lights. ?Make sure that you have a light by your bed that is easy to reach. ?Do not use any sheets or  blankets that are too big for your bed. They should not hang down onto the floor. ?Have a firm chair that has side arms. You can use this for support while you get dressed. ?Do not have throw rugs and other things on the floor that can make you trip. ?What can I do in the kitchen? ?Clean up any spills right away. ?Avoid walking on wet floors. ?Keep items that you use a lot in easy-to-reach places. ?If you need to reach something above you, use a strong step stool that has a grab bar. ?Keep electrical cords out of the way. ?Do not use floor polish or wax that makes floors slippery. If you must use wax, use non-skid floor wax. ?Do not have throw rugs and other things on the floor that can make you trip. ?What can I do with my stairs? ?Do not leave any items on the stairs. ?Make sure that there are handrails on both sides of the stairs and use them. Fix handrails that are broken or loose. Make sure that handrails are as long as the stairways. ?Check any carpeting to make sure that it is firmly attached to the stairs. Fix any carpet that is loose or worn. ?Avoid having throw rugs at the top or bottom of the stairs. If you do have throw rugs, attach them to the floor with carpet tape. ?Make  sure that you have a light switch at the top of the stairs and the bottom of the stairs. If you do not have them, ask someone to add them for you. ?What else can I do to help prevent falls? ?Wear shoes that: ?Do not have high heels. ?Have rubber bottoms. ?Are comfortable and fit you well. ?Are closed at the toe. Do not wear sandals. ?If you use a stepladder: ?Make sure that it is fully opened. Do not climb a closed stepladder. ?Make sure that both sides of the stepladder are locked into place. ?Ask someone to hold it for you, if possible. ?Clearly mark and make sure that you can see: ?Any grab bars or handrails. ?First and last steps. ?Where the edge of each step is. ?Use tools that help you move around (mobility aids) if they are  needed. These include: ?Canes. ?Walkers. ?Scooters. ?Crutches. ?Turn on the lights when you go into a dark area. Replace any light bulbs as soon as they burn out. ?Set up your furniture so you have a clear path.

## 2022-04-09 ENCOUNTER — Other Ambulatory Visit: Payer: Self-pay | Admitting: Family Medicine

## 2022-04-09 DIAGNOSIS — Z1231 Encounter for screening mammogram for malignant neoplasm of breast: Secondary | ICD-10-CM

## 2022-05-12 ENCOUNTER — Ambulatory Visit (INDEPENDENT_AMBULATORY_CARE_PROVIDER_SITE_OTHER): Payer: Medicare (Managed Care) | Admitting: Family Medicine

## 2022-05-12 VITALS — BP 120/74 | HR 85 | Temp 97.8°F | Ht 65.0 in | Wt 139.4 lb

## 2022-05-12 DIAGNOSIS — N393 Stress incontinence (female) (male): Secondary | ICD-10-CM | POA: Diagnosis not present

## 2022-05-12 DIAGNOSIS — F5101 Primary insomnia: Secondary | ICD-10-CM

## 2022-05-12 DIAGNOSIS — G43009 Migraine without aura, not intractable, without status migrainosus: Secondary | ICD-10-CM

## 2022-05-12 MED ORDER — SUMATRIPTAN SUCCINATE 100 MG PO TABS: ORAL_TABLET | ORAL | 3 refills | Status: DC

## 2022-05-12 NOTE — Progress Notes (Signed)
?[facility] ?[facility] ?[address] ?[place] [address] ?Dept: [phone] ?Dept Fax: [phone] ? ?Chronic Care Office Visit ? ?Subjective:  ? ? Patient ID: Shelly Campbell, female    DOB: 1941-12-19, 81 y.o..   MRN: [id number] ? ?Chief Complaint  ?Patient presents with  ? Follow-up  ?  6 month f/u.  C/o not sleeping. Tried Melatonin.   ? ? ?History of Present Illness: ? ?Patient is in today for reassessment of chronic medical issues. She presents with her daughter Garner Nash) ? ?Ms. Bronkema has a history of migraine headaches. She is unclear about how often she is having a migraine. She uses Imitrex for treating these when they occur. She does not want to be on any additional medications, so is not interested in migraine prophylaxis. ? ?Ms. Tuma notes she is having issues with sleep. This apparently is a family trait, as her mother had insomnia and so does her daughter. She  goes to be around 7 pm and typically does not fall asleep until about 2 am. She is up frequently with urination at night, as she has a history of stress incontinence and nocturia. She gets up around 8 am. She typically reads in bed, but sometimes watches TV. She denies any issues with bedroom temperature, light, or noise. She has tried using melatonin, up to 5 mg, but feels this is not very helpful. ? ?Past Medical History: ?Patient Active Problem List  ? Diagnosis Date Noted  ? Premature atrial contractions 05/07/2021  ? Stress incontinence with nocturia 05/06/2021  ? History of basal cell cancer 07/20/2017  ? Mild cognitive impairment 07/17/2016  ? Chronic kidney disease (CKD), stage II (mild) 08/16/2015  ? Chronic constipation 08/26/2014  ? Osteoporosis 03/14/2009  ? Obsessive-compulsive disorder 04/06/2007  ? Migraine headache without aura 04/06/2007  ? ?Past Surgical History:  ?Procedure Laterality Date  ? BREAST ENHANCEMENT SURGERY    ? bilat, 1973  ? ?Family History  ?Problem  Relation Age of Onset  ? Colon polyps Mother   ? Heart attack Mother   ? Breast cancer Mother   ? ALS Father   ? Heart disease Father   ? Hypertension Brother   ? Heart disease Brother   ? ?Outpatient Medications Prior to Visit  ?Medication Sig Dispense Refill  ? Cyanocobalamin (VITAMIN B-12) 1000 MCG/15ML LIQD Take 15 mLs (1,000 mcg total) by mouth daily. 450 mL 5  ? fluvoxaMINE (LUVOX) 100 MG tablet TAKE 1 TABLET(100 MG) BY MOUTH TWICE DAILY 180 tablet 0  ? polyethylene glycol powder (GLYCOLAX/MIRALAX) 17 GM/SCOOP powder DISSOLVE 17 GRAMS IN 8 OUNCES OF WATER AND DRINK BY MOUTH ONCE DAILY 510 g 1  ? nitrofurantoin (FURADANTIN) 25 MG/5ML suspension Take 10 mLs (50 mg total) by mouth 4 (four) times daily. 200 mL 0  ? SUMAtriptan (IMITREX) 100 MG tablet TAKE 1 TABLET(100 MG) BY MOUTH EVERY 2 HOURS AS NEEDED FOR MIGRAINE. MAY REPEAT IN 2 HOURS IF HEADACHE PERSISTS OR RECURS 12 tablet 3  ? calcium-vitamin D (OSCAL 500/200 D-3) 500-200 MG-UNIT tablet Take 1 tablet by mouth 2 (two) times daily. 180 tablet 3  ? ?No facility-administered medications prior to visit.  ? ?No Known Allergies ?   ?Objective:  ? ?Today's Vitals  ? 05/12/22 1259  ?BP: 120/74  ?Pulse: 85  ?Temp: 97.8 ?F (36.6 ?C)  ?TempSrc: Temporal  ?SpO2: 97%  ?Weight: 139 lb 6.4 oz (63.2 kg)  ?Height: '5\' 5"'$  (1.651 m)  ? ?Body mass index  is 23.2 kg/m?.  ? ?General: Well developed, well nourished. No acute distress. ?Psych: Alert and oriented. Normal mood and affect. ? ?Health Maintenance Due  ?Topic Date Due  ? Zoster Vaccines- Shingrix (1 of 2) Never done  ? COVID-19 Vaccine (3 - Pfizer risk series) 08/14/2020  ?   ?Assessment & Plan:  ? ?1. Migraine without aura and without status migrainosus, not intractable ?Apparently, Ms. Culliver is using < 12 doses a month. She prefers to not be on additional meds, so does not want to consider prophylaxis. ? ?- SUMAtriptan (IMITREX) 100 MG tablet; TAKE 1 TABLET(100 MG) BY MOUTH EVERY 2 HOURS AS NEEDED FOR MIGRAINE. MAY  REPEAT IN 2 HOURS IF HEADACHE PERSISTS OR RECURS  Dispense: 12 tablet; Refill: 3 ? ?2. Primary insomnia ?We discussed sleep hygiene, esp. avoiding non-sleep activities in bed. She can try increasing her melatonin dose, since she is willing to use this approach. ? ?3. Stress incontinence with nocturia ?I suggested we might try a medication to reduce bladder overactivity. We discussed avoiding fluids after dinner. ? ?Return in about 6 months (around 11/12/2022) for Reassessment.  ? ?Haydee Salter, MD ?

## 2022-06-02 ENCOUNTER — Ambulatory Visit: Payer: Medicare (Managed Care)

## 2022-08-21 ENCOUNTER — Other Ambulatory Visit: Payer: Self-pay | Admitting: Family Medicine

## 2022-08-21 DIAGNOSIS — F429 Obsessive-compulsive disorder, unspecified: Secondary | ICD-10-CM

## 2022-10-27 ENCOUNTER — Encounter: Payer: Self-pay | Admitting: Family Medicine

## 2022-10-27 ENCOUNTER — Telehealth (INDEPENDENT_AMBULATORY_CARE_PROVIDER_SITE_OTHER): Payer: Medicare (Managed Care) | Admitting: Family Medicine

## 2022-10-27 DIAGNOSIS — R519 Headache, unspecified: Secondary | ICD-10-CM

## 2022-10-27 DIAGNOSIS — Z20822 Contact with and (suspected) exposure to covid-19: Secondary | ICD-10-CM | POA: Diagnosis not present

## 2022-10-27 DIAGNOSIS — R051 Acute cough: Secondary | ICD-10-CM | POA: Diagnosis not present

## 2022-10-27 MED ORDER — MOLNUPIRAVIR EUA 200MG CAPSULE: 4.0000 | ORAL_CAPSULE | Freq: Two times a day (BID) | ORAL | 0 refills | Status: AC

## 2022-10-27 NOTE — Progress Notes (Signed)
VIRTUAL VISIT VIA VIDEO  I connected with Shelly Campbell on 10/27/22 at  1:20 PM EDT by a video enabled telemedicine application and verified that I am speaking with the correct person using two identifiers. Location patient: Home Location provider: [facility], Office Persons participating in the virtual visit: Patient, Dr. Raoul Pitch and V.Smith, [place] discussed the limitations of evaluation and management by telemedicine and the availability of in person appointments. The patient expressed understanding and agreed to proceed.     Shelly Campbell , 1941/03/15, 81 y.o., female MRN: [id number] Patient Care Team    Relationship Specialty Notifications Start End  Haydee Salter, MD PCP - General Family Medicine  11/11/21   Freada Bergeron, MD Consulting Physician Cardiology  11/11/21     Chief Complaint  Patient presents with   Covid Exposure    Body ache, cough, headache     Subjective: Pt presents for an OV with complaints of sore throat.  Body aches, cough of headache of 2 days duration.   Has had covid vaccines.  GFR> 60. Her daughter cares for her daily and tested positive yesterday.       04/08/2022   11:20 AM 04/08/2022   11:17 AM 05/06/2021    2:35 PM 12/30/2018    2:36 PM 11/16/2018    9:43 AM  Depression screen PHQ 2/9  Decreased Interest 0 0 0 0 0  Down, Depressed, Hopeless 0 0 0 0 0  PHQ - 2 Score 0 0 0 0 0  Altered sleeping    0 0  Tired, decreased energy    0 0  Change in appetite    0 0  Feeling bad or failure about yourself     1 0  Trouble concentrating    2 3  Moving slowly or fidgety/restless    0 0  Suicidal thoughts    0 0  PHQ-9 Score    3 3  Difficult doing work/chores    Not difficult at all Not difficult at all    No Known Allergies Social History   Social History Narrative   Patient is a Restaurant manager, fast food.   Past Medical History:  Diagnosis Date   Allergic rhinitis    Anxiety    Basal cell carcinoma    left  temple   Blood in stool    normal colonoscopy 2007   Chronic fatigue syndrome    Hyperlipidemia 08/26/2014   Migraine    Multiple chemical sensitivity syndrome    OCD (obsessive compulsive disorder)    Osteoporosis    Schatzki's ring 12/09/2011   Vitamin D deficiency    Past Surgical History:  Procedure Laterality Date   BREAST ENHANCEMENT SURGERY     bilat, 1973   Family History  Problem Relation Age of Onset   Colon polyps Mother    Heart attack Mother    Breast cancer Mother    ALS Father    Heart disease Father    Hypertension Brother    Heart disease Brother    Allergies as of 10/27/2022   No Known Allergies      Medication List        Accurate as of October 27, 2022  1:24 PM. If you have any questions, ask your nurse or doctor.          calcium-vitamin D 500-200 MG-UNIT tablet Commonly known as: Oscal 500/200 D-3 Take 1 tablet by mouth 2 (two) times  daily.   fluvoxaMINE 100 MG tablet Commonly known as: LUVOX TAKE 1 TABLET(100 MG) BY MOUTH TWICE DAILY   polyethylene glycol powder 17 GM/SCOOP powder Commonly known as: GLYCOLAX/MIRALAX DISSOLVE 17 GRAMS IN 8 OUNCES OF WATER AND DRINK BY MOUTH ONCE DAILY   SUMAtriptan 100 MG tablet Commonly known as: IMITREX TAKE 1 TABLET(100 MG) BY MOUTH EVERY 2 HOURS AS NEEDED FOR MIGRAINE. MAY REPEAT IN 2 HOURS IF HEADACHE PERSISTS OR RECURS   Vitamin B-12 1000 MCG/15ML Liqd Take 15 mLs (1,000 mcg total) by mouth daily.        All past medical history, surgical history, allergies, family history, immunizations andmedications were updated in the EMR today and reviewed under the history and medication portions of their EMR.     Review of Systems  Constitutional:  Positive for malaise/fatigue. Negative for chills and fever.  HENT:  Positive for congestion, sinus pain and sore throat.   Respiratory:  Positive for cough. Negative for sputum production, shortness of breath and wheezing.   Gastrointestinal:   Negative for abdominal pain, constipation, diarrhea, nausea and vomiting.  Musculoskeletal:  Positive for myalgias.  Neurological:  Positive for headaches. Negative for dizziness.   Negative, with the exception of above mentioned in HPI   Objective:  There were no vitals taken for this visit. There is no height or weight on file to calculate BMI. Physical Exam Vitals and nursing note reviewed.  Constitutional:      General: She is not in acute distress.    Appearance: Normal appearance. She is normal weight. She is not ill-appearing or toxic-appearing.  HENT:     Head: Normocephalic and atraumatic.  Eyes:     General:        Right eye: No discharge.        Left eye: No discharge.     Extraocular Movements: Extraocular movements intact.     Conjunctiva/sclera: Conjunctivae normal.     Pupils: Pupils are equal, round, and reactive to light.  Pulmonary:     Effort: Pulmonary effort is normal. No respiratory distress.  Neurological:     Mental Status: She is alert and oriented to person, place, and time. Mental status is at baseline.  Psychiatric:        Mood and Affect: Mood normal.        Behavior: Behavior normal.        Thought Content: Thought content normal.        Judgment: Judgment normal.      No results found. No results found. No results found for this or any previous visit (from the past 24 hour(s)).  Assessment/Plan: Shelly Campbell is a 81 y.o. female present for OV for  Close exposure to COVID-19 virus/Nonintractable headache, unspecified chronicity pattern, unspecified headache type/Acute cough Rest, hydrate.  mucinex (DM if cough) OTC molnupiravir prescribed, take until completed.  Reviewed home care instructions for COVID. Advised self-isolation at home for at least 5 days.   If symptoms, esp, dyspnea develops/worsens, recommend in-person evaluation at either an urgent care or the emergency room. F/U 2 weeks if not improved.   Reviewed expectations  re: course of current medical issues. Discussed self-management of symptoms. Outlined signs and symptoms indicating need for more acute intervention. Patient verbalized understanding and all questions were answered. Patient received an After-Visit Summary.    No orders of the defined types were placed in this encounter.  No orders of the defined types were placed in this encounter.  Referral Orders  No referral(s) requested today     Note is dictated utilizing voice recognition software. Although note has been proof read prior to signing, occasional typographical errors still can be missed. If any questions arise, please do not hesitate to call for verification.   electronically signed by:  Howard Pouch, DO  [place]

## 2022-10-28 DIAGNOSIS — Z0000 Encounter for general adult medical examination without abnormal findings: Secondary | ICD-10-CM | POA: Diagnosis not present

## 2022-10-30 ENCOUNTER — Ambulatory Visit
Admission: EM | Admit: 2022-10-30 | Discharge: 2022-10-30 | Disposition: A | Discharge status: Home / Self Care | Payer: Medicare (Managed Care) | Attending: Internal Medicine | Admitting: Internal Medicine

## 2022-10-30 ENCOUNTER — Encounter: Payer: Self-pay | Admitting: Emergency Medicine

## 2022-10-30 DIAGNOSIS — J069 Acute upper respiratory infection, unspecified: Secondary | ICD-10-CM

## 2022-10-30 DIAGNOSIS — Z20822 Contact with and (suspected) exposure to covid-19: Secondary | ICD-10-CM | POA: Diagnosis not present

## 2022-10-30 DIAGNOSIS — J029 Acute pharyngitis, unspecified: Secondary | ICD-10-CM

## 2022-10-30 LAB — POCT RAPID STREP A (OFFICE): Rapid Strep A Screen: NEGATIVE

## 2022-10-30 LAB — SARS CORONAVIRUS 2 (TAT 6-24 HRS): SARS Coronavirus 2: POSITIVE — AB

## 2022-10-30 MED ORDER — GUAIFENESIN 100 MG/5ML PO LIQD: 100.0000 mg | ORAL | 0 refills | Status: DC | PRN

## 2022-10-30 MED ORDER — CHLORASEPTIC 1.4 % MT LIQD: 1.0000 | OROMUCOSAL | 0 refills | Status: DC | PRN

## 2022-10-30 MED ORDER — BENZONATATE 100 MG PO CAPS: 100.0000 mg | ORAL_CAPSULE | Freq: Three times a day (TID) | ORAL | 0 refills | Status: DC | PRN

## 2022-10-30 NOTE — ED Provider Notes (Signed)
[facility]    CSN: [id number] Arrival date & time: 10/30/22  1238      History   Chief Complaint Chief Complaint  Patient presents with   Headache   Sore Throat   Cough    Congestion      HPI Shelly Campbell is a 81 y.o. female.   Patient presents with sore throat, nasal congestion, cough that started about 5 days ago.  She reports that her daughter recently tested positive for COVID-19 as she had a close exposure with her.  She had E-visit on 10/27/2022 with PCP who prescribed her molnupiravir.  She reports that she has not taken this medication as it is a capsule and she has to crush her medications to be able to swallow them.  Denies chest pain, shortness of breath, ear pain, nausea, vomiting, diarrhea, abdominal pain.  Patient denies that she has taken any other medications to alleviate symptoms.  Denies any formal diagnosis of asthma or COPD.   Headache Sore Throat  Cough   Past Medical History:  Diagnosis Date   Allergic rhinitis    Anxiety    Basal cell carcinoma    left temple   Blood in stool    normal colonoscopy 2007   Chronic fatigue syndrome    Hyperlipidemia 08/26/2014   Migraine    Multiple chemical sensitivity syndrome    OCD (obsessive compulsive disorder)    Osteoporosis    Schatzki's ring 12/09/2011   Vitamin D deficiency     Patient Active Problem List   Diagnosis Date Noted   Premature atrial contractions 05/07/2021   Stress incontinence with nocturia 05/06/2021   History of basal cell cancer 07/20/2017   Mild cognitive impairment 07/17/2016   Chronic kidney disease (CKD), stage II (mild) 08/16/2015   Chronic constipation 08/26/2014   Osteoporosis 03/14/2009   Obsessive-compulsive disorder 04/06/2007   Migraine headache without aura 04/06/2007    Past Surgical History:  Procedure Laterality Date   BREAST ENHANCEMENT SURGERY     bilat, 1973    OB History   No obstetric history on file.      Home Medications     Prior to Admission medications   Medication Sig Start Date End Date Taking? Authorizing Provider  guaiFENesin (ROBITUSSIN) 100 MG/5ML liquid Take 5-10 mLs (100-200 mg total) by mouth every 4 (four) hours as needed for cough or to loosen phlegm. 10/30/22  Yes , Hildred Alamin E, FNP  phenol (CHLORASEPTIC) 1.4 % LIQD Use as directed 1 spray in the mouth or throat as needed for throat irritation / pain. 10/30/22  Yes , Hildred Alamin E, FNP  calcium-vitamin D (OSCAL 500/200 D-3) 500-200 MG-UNIT tablet Take 1 tablet by mouth 2 (two) times daily. 07/22/18 11/11/21  Molt, Bethany, DO  Cyanocobalamin (VITAMIN B-12) 1000 MCG/15ML LIQD Take 15 mLs (1,000 mcg total) by mouth daily. 07/22/18   Molt, Bethany, DO  fluvoxaMINE (LUVOX) 100 MG tablet TAKE 1 TABLET(100 MG) BY MOUTH TWICE DAILY 08/21/22   Haydee Salter, MD  molnupiravir EUA (LAGEVRIO) 200 mg CAPS capsule Take 4 capsules (800 mg total) by mouth 2 (two) times daily for 5 days. 10/27/22 11/01/22  Kuneff, Renee A, DO  polyethylene glycol powder (GLYCOLAX/MIRALAX) 17 GM/SCOOP powder DISSOLVE 17 GRAMS IN 8 OUNCES OF WATER AND DRINK BY MOUTH ONCE DAILY 04/09/19   [place], Carley D, DO  SUMAtriptan (IMITREX) 100 MG tablet TAKE 1 TABLET(100 MG) BY MOUTH EVERY 2 HOURS AS NEEDED FOR MIGRAINE. MAY REPEAT IN 2 HOURS  IF HEADACHE PERSISTS OR RECURS 05/12/22   Haydee Salter, MD    Family History Family History  Problem Relation Age of Onset   Colon polyps Mother    Heart attack Mother    Breast cancer Mother    ALS Father    Heart disease Father    Hypertension Brother    Heart disease Brother     Social History Social History   Tobacco Use   Smoking status: Never   Smokeless tobacco: Never  Substance Use Topics   Alcohol use: No   Drug use: No     Allergies   Patient has no known allergies.   Review of Systems Review of Systems Per HPI  Physical Exam Triage Vital Signs ED Triage Vitals  Enc Vitals Group     BP 10/30/22 1326 (!) 153/78      Pulse Rate 10/30/22 1326 69     Resp 10/30/22 1326 18     Temp 10/30/22 1326 98 F (36.7 C)     Temp src --      SpO2 10/30/22 1326 96 %     Weight --      Height --      Head Circumference --      Peak Flow --      Pain Score 10/30/22 1324 5     Pain Loc --      Pain Edu? --      Excl. in [place]? --    No data found.  Updated Vital Signs BP (!) 153/78   Pulse 69   Temp 98 F (36.7 C)   Resp 18   SpO2 96%   Visual Acuity Right Eye Distance:   Left Eye Distance:   Bilateral Distance:    Right Eye Near:   Left Eye Near:    Bilateral Near:     Physical Exam Constitutional:      General: She is not in acute distress.    Appearance: Normal appearance. She is not toxic-appearing or diaphoretic.  HENT:     Head: Normocephalic and atraumatic.     Right Ear: Tympanic membrane and ear canal normal.     Left Ear: Tympanic membrane and ear canal normal.     Nose: Congestion present.     Mouth/Throat:     Mouth: Mucous membranes are moist.     Pharynx: Posterior oropharyngeal erythema present. No pharyngeal swelling or oropharyngeal exudate.     Tonsils: No tonsillar exudate or tonsillar abscesses.  Eyes:     Extraocular Movements: Extraocular movements intact.     Conjunctiva/sclera: Conjunctivae normal.     Pupils: Pupils are equal, round, and reactive to light.  Cardiovascular:     Rate and Rhythm: Normal rate and regular rhythm.     Pulses: Normal pulses.     Heart sounds: Normal heart sounds.  Pulmonary:     Effort: Pulmonary effort is normal. No respiratory distress.     Breath sounds: Normal breath sounds. No stridor. No wheezing, rhonchi or rales.  Abdominal:     General: Abdomen is flat. Bowel sounds are normal.     Palpations: Abdomen is soft.  Musculoskeletal:        General: Normal range of motion.     Cervical back: Normal range of motion.  Skin:    General: Skin is warm and dry.  Neurological:     General: No focal deficit present.     Mental Status:  She is alert and oriented  to person, place, and time. Mental status is at baseline.  Psychiatric:        Mood and Affect: Mood normal.        Behavior: Behavior normal.      UC Treatments / Results  Labs (all labs ordered are listed, but only abnormal results are displayed) Labs Reviewed  CULTURE, GROUP A STREP ([place])  SARS CORONAVIRUS 2 (TAT 6-24 HRS)  POCT RAPID STREP A (OFFICE)    EKG   Radiology No results found.  Procedures Procedures (including critical care time)  Medications Ordered in UC Medications - No data to display  Initial Impression / Assessment and Plan / UC Course  I have reviewed the triage vital signs and the nursing notes.  Pertinent labs & imaging results that were available during my care of the patient were reviewed by me and considered in my medical decision making (see chart for details).     Patient presents with symptoms likely from a viral upper respiratory infection. Differential includes bacterial pneumonia, sinusitis, allergic rhinitis, covid 19, flu, RSV.  Most likely COVID-19 given patient's close exposure.  Do not suspect underlying cardiopulmonary process. Symptoms seem unlikely related to ACS, CHF or COPD exacerbations, pneumonia, pneumothorax. Patient is nontoxic appearing and not in need of emergent medical intervention.  Rapid strep was negative.  Throat culture and COVID test pending.  Patient previously prescribed molnupiravir by PCP but reports that she has not taken this given that it is in capsule form as she is not able to take capsules.  Do not have any recent GFR/blood work to be able to possibly prescribe Paxlovid.  Unable to do stat blood work here at this urgent care as well so will not get blood work in time for patient to take Paxlovid within the treatment window.  Therefore, symptomatic treatment is best treatment option at this time.  Will prescribe liquid guaifenesin and Chloraseptic spray.  Discussed supportive care and  symptom management with patient.  Return if symptoms fail to improve.  Discussed tricked return precautions.  Patient states understanding and is agreeable.  Discharged with PCP followup.  Final Clinical Impressions(s) / UC Diagnoses   Final diagnoses:  Viral upper respiratory tract infection with cough  Sore throat  Close exposure to COVID-19 virus     Discharge Instructions      Rapid strep test was negative.  Throat culture and COVID test are pending.  We will call if they are positive.  It is most likely that you have COVID given your close exposure.  I have prescribed 2 medications to alleviate symptoms.  Please follow-up if any symptoms persist or worsen.    ED Prescriptions     Medication Sig Dispense Auth. Provider   benzonatate (TESSALON) 100 MG capsule  (Status: Discontinued) Take 1 capsule (100 mg total) by mouth every 8 (eight) hours as needed for cough. 21 capsule [place], [place] E, [place]   phenol (CHLORASEPTIC) 1.4 % LIQD Use as directed 1 spray in the mouth or throat as needed for throat irritation / pain. 118 mL , Hildred Alamin E, [place]   guaiFENesin (ROBITUSSIN) 100 MG/5ML liquid Take 5-10 mLs (100-200 mg total) by mouth every 4 (four) hours as needed for cough or to loosen phlegm. 60 mL Teodora Medici, [place]      PDMP not reviewed this encounter.   Teodora Medici, [place] 10/30/22 1414

## 2022-10-30 NOTE — Discharge Instructions (Addendum)
Rapid strep test was negative.  Throat culture and COVID test are pending.  We will call if they are positive.  It is most likely that you have COVID given your close exposure.  I have prescribed 2 medications to alleviate symptoms.  Please follow-up if any symptoms persist or worsen.

## 2022-10-30 NOTE — ED Triage Notes (Signed)
Pt is present today with c/o HA, sore throat, congestion, and cough. Pt sx started Monday

## 2022-11-02 LAB — CULTURE, GROUP A STREP (THRC)

## 2023-04-12 ENCOUNTER — Ambulatory Visit (INDEPENDENT_AMBULATORY_CARE_PROVIDER_SITE_OTHER): Payer: No Typology Code available for payment source

## 2023-04-12 VITALS — Ht 65.0 in | Wt 141.0 lb

## 2023-04-12 DIAGNOSIS — Z0000 Encounter for general adult medical examination without abnormal findings: Secondary | ICD-10-CM | POA: Diagnosis not present

## 2023-04-12 NOTE — Patient Instructions (Signed)
Ms. Degarmo , Thank you for taking time to come for your Medicare Wellness Visit. I appreciate your ongoing commitment to your health goals. Please review the following plan we discussed and let me know if I can assist you in the future.   These are the goals we discussed:  Goals      Patient Stated     04/12/2023, no goals        This is a list of the screening recommended for you and due dates:  Health Maintenance  Topic Date Due   Zoster (Shingles) Vaccine (1 of 2) Never done   COVID-19 Vaccine (4 - 2023-24 season) 11/24/2022   Flu Shot  07/29/2023   DTaP/Tdap/Td vaccine (2 - Td or Tdap) 08/12/2023   Medicare Annual Wellness Visit  04/11/2024   Pneumonia Vaccine  Completed   DEXA scan (bone density measurement)  Completed   HPV Vaccine  Aged Out    Advanced directives: copy in chart  Conditions/risks identified: none  Next appointment: Follow up in one year for your annual wellness visit    Preventive Care 65 Years and Older, Female Preventive care refers to lifestyle choices and visits with your health care provider that can promote health and wellness. What does preventive care include? A yearly physical exam. This is also called an annual well check. Dental exams once or twice a year. Routine eye exams. Ask your health care provider how often you should have your eyes checked. Personal lifestyle choices, including: Daily care of your teeth and gums. Regular physical activity. Eating a healthy diet. Avoiding tobacco and drug use. Limiting alcohol use. Practicing safe sex. Taking low-dose aspirin every day. Taking vitamin and mineral supplements as recommended by your health care provider. What happens during an annual well check? The services and screenings done by your health care provider during your annual well check will depend on your age, overall health, lifestyle risk factors, and family history of disease. Counseling  Your health care provider may ask  you questions about your: Alcohol use. Tobacco use. Drug use. Emotional well-being. Home and relationship well-being. Sexual activity. Eating habits. History of falls. Memory and ability to understand (cognition). Work and work Astronomer. Reproductive health. Screening  You may have the following tests or measurements: Height, weight, and BMI. Blood pressure. Lipid and cholesterol levels. These may be checked every 5 years, or more frequently if you are over 46 years old. Skin check. Lung cancer screening. You may have this screening every year starting at age 67 if you have a 30-pack-year history of smoking and currently smoke or have quit within the past 15 years. Fecal occult blood test (FOBT) of the stool. You may have this test every year starting at age 84. Flexible sigmoidoscopy or colonoscopy. You may have a sigmoidoscopy every 5 years or a colonoscopy every 10 years starting at age 65. Hepatitis C blood test. Hepatitis B blood test. Sexually transmitted disease (STD) testing. Diabetes screening. This is done by checking your blood sugar (glucose) after you have not eaten for a while (fasting). You may have this done every 1-3 years. Bone density scan. This is done to screen for osteoporosis. You may have this done starting at age 21. Mammogram. This may be done every 1-2 years. Talk to your health care provider about how often you should have regular mammograms. Talk with your health care provider about your test results, treatment options, and if necessary, the need for more tests. Vaccines  Your health care  provider may recommend certain vaccines, such as: Influenza vaccine. This is recommended every year. Tetanus, diphtheria, and acellular pertussis (Tdap, Td) vaccine. You may need a Td booster every 10 years. Zoster vaccine. You may need this after age 66. Pneumococcal 13-valent conjugate (PCV13) vaccine. One dose is recommended after age 52. Pneumococcal  polysaccharide (PPSV23) vaccine. One dose is recommended after age 40. Talk to your health care provider about which screenings and vaccines you need and how often you need them. This information is not intended to replace advice given to you by your health care provider. Make sure you discuss any questions you have with your health care provider. Document Released: 01/10/2016 Document Revised: 09/02/2016 Document Reviewed: 10/15/2015 Elsevier Interactive Patient Education  2017 [place] Prevention in the Home Falls can cause injuries. They can happen to people of all ages. There are many things you can do to make your home safe and to help prevent falls. What can I do on the outside of my home? Regularly fix the edges of walkways and driveways and fix any cracks. Remove anything that might make you trip as you walk through a door, such as a raised step or threshold. Trim any bushes or trees on the path to your home. Use bright outdoor lighting. Clear any walking paths of anything that might make someone trip, such as rocks or tools. Regularly check to see if handrails are loose or broken. Make sure that both sides of any steps have handrails. Any raised decks and porches should have guardrails on the edges. Have any leaves, snow, or ice cleared regularly. Use sand or salt on walking paths during winter. Clean up any spills in your garage right away. This includes oil or grease spills. What can I do in the bathroom? Use night lights. Install grab bars by the toilet and in the tub and shower. Do not use towel bars as grab bars. Use non-skid mats or decals in the tub or shower. If you need to sit down in the shower, use a plastic, non-slip stool. Keep the floor dry. Clean up any water that spills on the floor as soon as it happens. Remove soap buildup in the tub or shower regularly. Attach bath mats securely with double-sided non-slip rug tape. Do not have throw rugs and other  things on the floor that can make you trip. What can I do in the bedroom? Use night lights. Make sure that you have a light by your bed that is easy to reach. Do not use any sheets or blankets that are too big for your bed. They should not hang down onto the floor. Have a firm chair that has side arms. You can use this for support while you get dressed. Do not have throw rugs and other things on the floor that can make you trip. What can I do in the kitchen? Clean up any spills right away. Avoid walking on wet floors. Keep items that you use a lot in easy-to-reach places. If you need to reach something above you, use a strong step stool that has a grab bar. Keep electrical cords out of the way. Do not use floor polish or wax that makes floors slippery. If you must use wax, use non-skid floor wax. Do not have throw rugs and other things on the floor that can make you trip. What can I do with my stairs? Do not leave any items on the stairs. Make sure that there are handrails on both  sides of the stairs and use them. Fix handrails that are broken or loose. Make sure that handrails are as long as the stairways. Check any carpeting to make sure that it is firmly attached to the stairs. Fix any carpet that is loose or worn. Avoid having throw rugs at the top or bottom of the stairs. If you do have throw rugs, attach them to the floor with carpet tape. Make sure that you have a light switch at the top of the stairs and the bottom of the stairs. If you do not have them, ask someone to add them for you. What else can I do to help prevent falls? Wear shoes that: Do not have high heels. Have rubber bottoms. Are comfortable and fit you well. Are closed at the toe. Do not wear sandals. If you use a stepladder: Make sure that it is fully opened. Do not climb a closed stepladder. Make sure that both sides of the stepladder are locked into place. Ask someone to hold it for you, if possible. Clearly  mark and make sure that you can see: Any grab bars or handrails. First and last steps. Where the edge of each step is. Use tools that help you move around (mobility aids) if they are needed. These include: Canes. Walkers. Scooters. Crutches. Turn on the lights when you go into a dark area. Replace any light bulbs as soon as they burn out. Set up your furniture so you have a clear path. Avoid moving your furniture around. If any of your floors are uneven, fix them. If there are any pets around you, be aware of where they are. Review your medicines with your doctor. Some medicines can make you feel dizzy. This can increase your chance of falling. Ask your doctor what other things that you can do to help prevent falls. This information is not intended to replace advice given to you by your health care provider. Make sure you discuss any questions you have with your health care provider. Document Released: 10/10/2009 Document Revised: 05/21/2016 Document Reviewed: 01/18/2015 Elsevier Interactive Patient Education  2017 [employer].

## 2023-04-12 NOTE — Progress Notes (Signed)
I connected with  Shelly Campbell on 04/12/23 by a audio enabled telemedicine application and verified that I am speaking with the correct person using two identifiers.  Patient Location: Home  Provider Location: Office/Clinic  I discussed the limitations of evaluation and management by telemedicine. The patient expressed understanding and agreed to proceed.  Subjective:   Shelly Campbell is a 82 y.o. female who presents for Medicare Annual (Subsequent) preventive examination.  Review of Systems     Cardiac Risk Factors include: advanced age (>33men, >49 women)     Objective:    Today's Vitals   04/12/23 1011  Weight: 141 lb (64 kg)  Height:  (1.651 m)   Body mass index is 23.46 kg/m.     04/12/2023   10:17 AM 04/08/2022   11:18 AM 05/06/2021    2:33 PM 12/30/2018    2:33 PM 11/16/2018    9:39 AM 07/22/2018    2:29 PM 07/20/2017    3:42 PM  Advanced Directives  Does Patient Have a Medical Advance Directive? Yes Yes Yes No No No No  Type of Estate agent of [place];Living will Healthcare Power of [place];Living will Healthcare Power of [place];Living will      Copy of Healthcare Power of Attorney in Chart? No - copy requested Yes - validated most recent copy scanned in chart (See row information) No - copy requested      Would patient like information on creating a medical advance directive?   No - Patient declined No - Patient declined Yes (MAU/Ambulatory/Procedural Areas - Information given) No - Patient declined Yes (MAU/Ambulatory/Procedural Areas - Information given)    Current Medications (verified) Outpatient Encounter Medications as of 04/12/2023  Medication Sig   Cyanocobalamin (VITAMIN B-12) 1000 MCG/15ML LIQD Take 15 mLs (1,000 mcg total) by mouth daily.   fluvoxaMINE (LUVOX) 100 MG tablet TAKE 1 TABLET(100 MG) BY MOUTH TWICE DAILY   glucosamine-chondroitin 500-400 MG tablet Take 1 tablet by mouth daily.   guaiFENesin (ROBITUSSIN)  100 MG/5ML liquid Take 5-10 mLs (100-200 mg total) by mouth every 4 (four) hours as needed for cough or to loosen phlegm.   phenol (CHLORASEPTIC) 1.4 % LIQD Use as directed 1 spray in the mouth or throat as needed for throat irritation / pain.   SUMAtriptan (IMITREX) 100 MG tablet TAKE 1 TABLET(100 MG) BY MOUTH EVERY 2 HOURS AS NEEDED FOR MIGRAINE. MAY REPEAT IN 2 HOURS IF HEADACHE PERSISTS OR RECURS   calcium-vitamin D (OSCAL 500/200 D-3) 500-200 MG-UNIT tablet Take 1 tablet by mouth 2 (two) times daily.   polyethylene glycol powder (GLYCOLAX/MIRALAX) 17 GM/SCOOP powder DISSOLVE 17 GRAMS IN 8 OUNCES OF WATER AND DRINK BY MOUTH ONCE DAILY (Patient not taking: Reported on 04/12/2023)   No facility-administered encounter medications on file as of 04/12/2023.    Allergies (verified) Patient has no known allergies.   History: Past Medical History:  Diagnosis Date   Allergic rhinitis    Anxiety    Basal cell carcinoma    left temple   Blood in stool    normal colonoscopy 2007   Chronic fatigue syndrome    Hyperlipidemia 08/26/2014   Migraine    Multiple chemical sensitivity syndrome    OCD (obsessive compulsive disorder)    Osteoporosis    Schatzki's ring 12/09/2011   Vitamin D deficiency    Past Surgical History:  Procedure Laterality Date   BREAST ENHANCEMENT SURGERY     bilat, 1973   Family History  Problem  Relation Age of Onset   Colon polyps Mother    Heart attack Mother    Breast cancer Mother    ALS Father    Heart disease Father    Hypertension Brother    Heart disease Brother    Social History   Socioeconomic History   Marital status: Legally Separated    Spouse name: Not on file   Number of children: 2   Years of education: Not on file   Highest education level: Not on file  Occupational History   Occupation: Housekeeping  Tobacco Use   Smoking status: Never   Smokeless tobacco: Never  Vaping Use   Vaping Use: Never used  Substance and Sexual Activity    Alcohol use: No   Drug use: No   Sexual activity: Not on file  Other Topics Concern   Not on file  Social History Narrative   Patient is a Scientist, product/process development.   Social Determinants of Health   Financial Resource Strain: Low Risk  (04/12/2023)   Overall Financial Resource Strain (CARDIA)    Difficulty of Paying Living Expenses: Not hard at all  Food Insecurity: No Food Insecurity (04/12/2023)   Hunger Vital Sign    Worried About Running Out of Food in the Last Year: Never true    Ran Out of Food in the Last Year: Never true  Transportation Needs: No Transportation Needs (04/12/2023)   PRAPARE - Administrator, Civil Service (Medical): No    Lack of Transportation (Non-Medical): No  Physical Activity: Inactive (04/12/2023)   Exercise Vital Sign    Days of Exercise per Week: 0 days    Minutes of Exercise per Session: 0 min  Stress: No Stress Concern Present (04/12/2023)   [employer] of Occupational Health - Occupational Stress Questionnaire    Feeling of Stress : Not at all  Social Connections: Moderately Isolated (04/08/2022)   Social Connection and Isolation Panel [NHANES]    Frequency of Communication with Friends and Family: Three times a week    Frequency of Social Gatherings with Friends and Family: Three times a week    Attends Religious Services: More than 4 times per year    Active Member of Clubs or Organizations: No    Attends Banker Meetings: Never    Marital Status: Widowed    Tobacco Counseling Counseling given: Not Answered   Clinical Intake:  Pre-visit preparation completed: Yes  Pain : No/denies pain     Nutritional Status: BMI of 19-24  Normal Nutritional Risks: None Diabetes: No  How often do you need to have someone help you when you read instructions, pamphlets, or other written materials from your doctor or pharmacy?: 1 - Never  Diabetic? no  Interpreter Needed?: No  Information entered by :: NAllen  LPN   Activities of Daily Living    04/12/2023   10:18 AM  In your present state of health, do you have any difficulty performing the following activities:  Hearing? 0  Vision? 1  Difficulty concentrating or making decisions? 1  Walking or climbing stairs? 0  Dressing or bathing? 0  Doing errands, shopping? 0  Preparing Food and eating ? N  Using the Toilet? N  In the past six months, have you accidently leaked urine? N  Do you have problems with loss of bowel control? N  Managing your Medications? N  Managing your Finances? N  Housekeeping or managing your Housekeeping? N    Patient Care Team: Veto Kemps,  Bertram Millard, MD as PCP - General (Family Medicine) Meriam Sprague, MD as Consulting Physician (Cardiology)  Indicate any recent Medical Services you may have received from other than [facility] providers in the past year (date may be approximate).     Assessment:   This is a routine wellness examination for Eizabeth.  Hearing/Vision screen Vision Screening - Comments:: No regular eye exams  Dietary issues and exercise activities discussed: Current Exercise Habits: The patient does not participate in regular exercise at present   Goals Addressed             This Visit's Progress    Patient Stated       04/12/2023, no goals       Depression Screen    04/12/2023   10:18 AM 04/08/2022   11:20 AM 04/08/2022   11:17 AM 05/06/2021    2:35 PM 12/30/2018    2:36 PM 11/16/2018    9:43 AM 07/22/2018    2:35 PM  PHQ 2/9 Scores  PHQ - 2 Score 0 0 0 0 0 0 0  PHQ- 9 Score     3 3     Fall Risk    04/12/2023   10:17 AM 04/08/2022   11:19 AM 11/11/2021   10:13 AM 12/30/2018    2:32 PM 11/16/2018    9:38 AM  Fall Risk   Falls in the past year? 0 0 0 0 0  Number falls in past yr: 0 0 0  0  Injury with Fall? 0 0 0    Risk for fall due to : Medication side effect  No Fall Risks    Follow up Falls prevention discussed;Education provided;Falls evaluation completed Falls evaluation  completed       FALL RISK PREVENTION PERTAINING TO THE HOME:  Any stairs in or around the home? No  If so, are there any without handrails? N/a Home free of loose throw rugs in walkways, pet beds, electrical cords, etc? Yes  Adequate lighting in your home to reduce risk of falls? Yes   ASSISTIVE DEVICES UTILIZED TO PREVENT FALLS:  Life alert? No  Use of a cane, walker or w/c? No  Grab bars in the bathroom? Yes  Shower chair or bench in shower? No  Elevated toilet seat or a handicapped toilet? Yes   TIMED UP AND GO:  Was the test performed? No .      Cognitive Function:      07/14/2016    3:23 PM  Montreal Cognitive Assessment   Visuospatial/ Executive (0/5) 1  Naming (0/3) 3  Attention: Read list of digits (0/2) 2  Attention: Read list of letters (0/1) 1  Attention: Serial 7 subtraction starting at 100 (0/3) 0  Language: Repeat phrase (0/2) 2  Language : Fluency (0/1) 1  Abstraction (0/2) 2  Delayed Recall (0/5) 3  Orientation (0/6) 6  Total 21  Adjusted Score (based on education) 21      04/12/2023   10:19 AM  6CIT Screen  What Year? 0 points  What month? 0 points  What time? 0 points  Count back from 20 0 points  Months in reverse 0 points  Repeat phrase 0 points  Total Score 0 points    Immunizations Immunization History  Administered Date(s) Administered   Influenza Split 10/09/2011, 09/05/2013   Influenza Whole 10/06/2007, 10/09/2008, 10/02/2009, 09/30/2010   Influenza, High Dose Seasonal PF 09/21/2019   Influenza,inj,Quad PF,6+ Mos 10/20/2015, 10/11/2018   Influenza-Unspecified 08/28/2014, 09/29/2021  PFIZER(Purple Top)SARS-COV-2 Vaccination 02/08/2020, 07/17/2020   PNEUMOCOCCAL CONJUGATE-20 09/29/2022   Pneumococcal Conjugate-13 08/15/2015   Pneumococcal Polysaccharide-23 08/11/2013   Tdap 08/11/2013   Unspecified SARS-COV-2 Vaccination 09/29/2022    TDAP status: Up to date  Flu Vaccine status: Up to date  Pneumococcal vaccine status:  Up to date  Covid-19 vaccine status: Completed vaccines  Qualifies for Shingles Vaccine? Yes   Zostavax completed Yes   Shingrix Completed?: No.    Education has been provided regarding the importance of this vaccine. Patient has been advised to call insurance company to determine out of pocket expense if they have not yet received this vaccine. Advised may also receive vaccine at local pharmacy or Health Dept. Verbalized acceptance and understanding.  Screening Tests Health Maintenance  Topic Date Due   Zoster Vaccines- Shingrix (1 of 2) Never done   COVID-19 Vaccine (4 - 2023-24 season) 11/24/2022   Medicare Annual Wellness (AWV)  04/09/2023   INFLUENZA VACCINE  07/29/2023   DTaP/Tdap/Td (2 - Td or Tdap) 08/12/2023   Pneumonia Vaccine 39+ Years old  Completed   DEXA SCAN  Completed   HPV VACCINES  Aged Out    Health Maintenance  Health Maintenance Due  Topic Date Due   Zoster Vaccines- Shingrix (1 of 2) Never done   COVID-19 Vaccine (4 - 2023-24 season) 11/24/2022   Medicare Annual Wellness (AWV)  04/09/2023    Colorectal cancer screening: No longer required.   Mammogram status: No longer required due to age.  Bone Density status: Completed 07/28/2013.   Lung Cancer Screening: (Low Dose CT Chest recommended if Age 10-80 years, 30 pack-year currently smoking OR have quit w/in 15years.) does not qualify.   Lung Cancer Screening Referral: no  Additional Screening:  Hepatitis C Screening: does not qualify;   Vision Screening: Recommended annual ophthalmology exams for early detection of glaucoma and other disorders of the eye. Is the patient up to date with their annual eye exam?  No  Who is the provider or what is the name of the office in which the patient attends annual eye exams? none If pt is not established with a provider, would they like to be referred to a provider to establish care? No .   Dental Screening: Recommended annual dental exams for proper oral  hygiene  Community Resource Referral / Chronic Care Management: CRR required this visit?  No   CCM required this visit?  No      Plan:     I have personally reviewed and noted the following in the patient's chart:   Medical and social history Use of alcohol, tobacco or illicit drugs  Current medications and supplements including opioid prescriptions. Patient is not currently taking opioid prescriptions. Functional ability and status Nutritional status Physical activity Advanced directives List of other physicians Hospitalizations, surgeries, and ER visits in previous 12 months Vitals Screenings to include cognitive, depression, and falls Referrals and appointments  In addition, I have reviewed and discussed with patient certain preventive protocols, quality metrics, and best practice recommendations. A written personalized care plan for preventive services as well as general preventive health recommendations were provided to patient.     Barb Merino, LPN   1/61/0960   Nurse Notes: none  Due to this being a virtual visit, the after visit summary with patients personalized plan was offered to patient via mail or my-chart.  to pick up at office at next visit

## 2023-05-07 DIAGNOSIS — Z85828 Personal history of other malignant neoplasm of skin: Secondary | ICD-10-CM | POA: Diagnosis not present

## 2023-05-07 DIAGNOSIS — L570 Actinic keratosis: Secondary | ICD-10-CM | POA: Diagnosis not present

## 2023-05-07 DIAGNOSIS — L814 Other melanin hyperpigmentation: Secondary | ICD-10-CM | POA: Diagnosis not present

## 2023-05-07 DIAGNOSIS — L821 Other seborrheic keratosis: Secondary | ICD-10-CM | POA: Diagnosis not present

## 2023-05-19 ENCOUNTER — Encounter: Payer: Self-pay | Admitting: Family Medicine

## 2023-05-19 ENCOUNTER — Ambulatory Visit (INDEPENDENT_AMBULATORY_CARE_PROVIDER_SITE_OTHER): Payer: No Typology Code available for payment source | Admitting: Family Medicine

## 2023-05-19 VITALS — BP 116/68 | HR 75 | Temp 98.6°F | Ht 65.0 in | Wt 141.6 lb

## 2023-05-19 DIAGNOSIS — S70362A Insect bite (nonvenomous), left thigh, initial encounter: Secondary | ICD-10-CM

## 2023-05-19 DIAGNOSIS — W57XXXA Bitten or stung by nonvenomous insect and other nonvenomous arthropods, initial encounter: Secondary | ICD-10-CM | POA: Diagnosis not present

## 2023-05-19 MED ORDER — DOXYCYCLINE HYCLATE 100 MG PO TABS: 100.0000 mg | ORAL_TABLET | Freq: Two times a day (BID) | ORAL | 0 refills | Status: DC

## 2023-05-19 NOTE — Assessment & Plan Note (Signed)
There is a rash at the site of the bite, whcih could be a local reaction. However, I am suspicious that the other lesion is also related to a tick bite. This appears to be much more than a local reaction. I will err on the side of caution and prescribe a course of doxycycline.

## 2023-05-19 NOTE — Progress Notes (Signed)
[facility] [address] [place] [address] Dept: [phone] Dept Fax: [phone]  Office Visit  Subjective:    Patient ID: Shelly Campbell, female    DOB: 01-26-1941, 82 y.o..   MRN: [id number]  Chief Complaint  Patient presents with   Rash    C/o having a tick bite on the LT thigh with redness.  Alson has a red/purple spont further up on leg.     History of Present Illness:  Patient is in today complaining of a tick bite to her left thigh. She found an attached tick yesterday. She notes she does have an area of redness around the site. She also noted a larger, darker lesion higher up on the left thigh, but no sign of a tick. g Past Medical History: Patient Active Problem List   Diagnosis Date Noted   Premature atrial contractions 05/07/2021   Stress incontinence with nocturia 05/06/2021   History of basal cell cancer 07/20/2017   Mild cognitive impairment 07/17/2016   Chronic kidney disease (CKD), stage II (mild) 08/16/2015   Chronic constipation 08/26/2014   Osteoporosis 03/14/2009   Obsessive-compulsive disorder 04/06/2007   Migraine headache without aura 04/06/2007   Past Surgical History:  Procedure Laterality Date   BREAST ENHANCEMENT SURGERY     bilat, 1973   Family History  Problem Relation Age of Onset   Colon polyps Mother    Heart attack Mother    Breast cancer Mother    ALS Father    Heart disease Father    Hypertension Brother    Heart disease Brother    Outpatient Medications Prior to Visit  Medication Sig Dispense Refill   calcium-vitamin D (OSCAL 500/200 D-3) 500-200 MG-UNIT tablet Take 1 tablet by mouth 2 (two) times daily. 180 tablet 3   Cyanocobalamin (VITAMIN B-12) 1000 MCG/15ML LIQD Take 15 mLs (1,000 mcg total) by mouth daily. 450 mL 5   fluvoxaMINE (LUVOX) 100 MG tablet TAKE 1 TABLET(100 MG) BY MOUTH TWICE DAILY 180 tablet 3   glucosamine-chondroitin 500-400 MG tablet Take 1  tablet by mouth daily.     guaiFENesin (ROBITUSSIN) 100 MG/5ML liquid Take 5-10 mLs (100-200 mg total) by mouth every 4 (four) hours as needed for cough or to loosen phlegm. 60 mL 0   phenol (CHLORASEPTIC) 1.4 % LIQD Use as directed 1 spray in the mouth or throat as needed for throat irritation / pain. 118 mL 0   SUMAtriptan (IMITREX) 100 MG tablet TAKE 1 TABLET(100 MG) BY MOUTH EVERY 2 HOURS AS NEEDED FOR MIGRAINE. MAY REPEAT IN 2 HOURS IF HEADACHE PERSISTS OR RECURS 12 tablet 3   polyethylene glycol powder (GLYCOLAX/MIRALAX) 17 GM/SCOOP powder DISSOLVE 17 GRAMS IN 8 OUNCES OF WATER AND DRINK BY MOUTH ONCE DAILY (Patient not taking: Reported on 04/12/2023) 510 g 1   No facility-administered medications prior to visit.   No Known Allergies   Objective:   Today's Vitals   05/19/23 1308  BP: 116/68  Pulse: 75  Temp: 98.6 F (37 C)  TempSrc: Temporal  SpO2: 98%  Weight: 141 lb 9.6 oz (64.2 kg)  Height: 5\' 5"  (1.651 m)   Body mass index is 23.56 kg/m.   General: Well developed, well nourished. No acute distress. Skin: Warm and dry. There is a 2 cm round, lightly red macule with mild induration over the inner aspect of the left thigh, closer to the   knee (site of the known tick bite). There is a  4-5 cm ovate area of echymosis with a central papule over the inner aspect of the more   proximal left thigh. Psych: Alert and oriented. Normal mood and affect.  There are no preventive care reminders to display for this patient.  Patient did bring in the tick, which was still alive.  This was a small brown tick. It did not show the white spot associated with a lone star tick.  Assessment & Plan:   Problem List Items Addressed This Visit       Musculoskeletal and Integument   Tick bite of left thigh - Primary    There is a rash at the site of the bite, whcih could be a local reaction. However, I am suspicious that the other lesion is also related to a tick bite. This appears to be much  more than a local reaction. I will err on the side of caution and prescribe a course of doxycycline.      Relevant Medications   doxycycline (VIBRA-TABS) 100 MG tablet   Other Relevant Orders   Lyme Disease Serology w/Reflex    Return in about 6 weeks (around 06/30/2023) for Reassessment.   Loyola Mast, MD

## 2023-05-22 LAB — LYME DISEASE SEROLOGY W/REFLEX: Lyme Total Antibody EIA: NEGATIVE

## 2023-05-31 DIAGNOSIS — H52223 Regular astigmatism, bilateral: Secondary | ICD-10-CM | POA: Diagnosis not present

## 2023-05-31 DIAGNOSIS — H524 Presbyopia: Secondary | ICD-10-CM | POA: Diagnosis not present

## 2023-09-19 ENCOUNTER — Other Ambulatory Visit: Payer: Self-pay | Admitting: Family Medicine

## 2023-09-19 DIAGNOSIS — G43009 Migraine without aura, not intractable, without status migrainosus: Secondary | ICD-10-CM

## 2023-09-19 DIAGNOSIS — F429 Obsessive-compulsive disorder, unspecified: Secondary | ICD-10-CM

## 2023-12-06 ENCOUNTER — Ambulatory Visit (INDEPENDENT_AMBULATORY_CARE_PROVIDER_SITE_OTHER): Payer: No Typology Code available for payment source | Admitting: Family Medicine

## 2023-12-06 VITALS — BP 124/78 | HR 69 | Temp 97.3°F | Ht 65.0 in | Wt 143.6 lb

## 2023-12-06 DIAGNOSIS — F429 Obsessive-compulsive disorder, unspecified: Secondary | ICD-10-CM | POA: Diagnosis not present

## 2023-12-06 DIAGNOSIS — G43009 Migraine without aura, not intractable, without status migrainosus: Secondary | ICD-10-CM | POA: Diagnosis not present

## 2023-12-06 DIAGNOSIS — Z0000 Encounter for general adult medical examination without abnormal findings: Secondary | ICD-10-CM

## 2023-12-06 DIAGNOSIS — H9193 Unspecified hearing loss, bilateral: Secondary | ICD-10-CM | POA: Diagnosis not present

## 2023-12-06 DIAGNOSIS — G3184 Mild cognitive impairment, so stated: Secondary | ICD-10-CM | POA: Diagnosis not present

## 2023-12-06 MED ORDER — SUMATRIPTAN SUCCINATE 100 MG PO TABS: ORAL_TABLET | ORAL | 3 refills | Status: AC

## 2023-12-06 MED ORDER — FLUVOXAMINE MALEATE 100 MG PO TABS: ORAL_TABLET | ORAL | 3 refills | Status: DC

## 2023-12-06 NOTE — Progress Notes (Signed)
[facility] [address] [place] [address] Dept: [phone] Dept Fax: [phone]  Annual Physical Visit  Subjective:    Patient ID: Shelly Campbell, female    DOB: [birth year], 82 y.o..   MRN: [id number]  Chief Complaint  Patient presents with   Annual Exam    CPE/labs.  C/o hearing issues, not sleeping, and memory issues.    History of Present Illness:  Patient is in today for an annual physical/preventative visit.  Review of Systems  Constitutional:  Negative for chills, diaphoresis, fever, malaise/fatigue and weight loss.  HENT:  Positive for hearing loss. Negative for congestion, ear pain, sinus pain, sore throat and tinnitus.        Shelly Campbell feels like she has difficulty with her hearing. Her daughter has noted this is an issue. She ahs never had a hearing evaluation.  Eyes:  Negative for blurred vision, pain, discharge and redness.  Respiratory:  Negative for cough, shortness of breath and wheezing.   Cardiovascular:  Negative for chest pain and palpitations.  Gastrointestinal:  Negative for abdominal pain, constipation, diarrhea, heartburn, nausea and vomiting.  Musculoskeletal:  Positive for back pain and joint pain. Negative for myalgias.       Patient gets pain and stiffness in lower back and other joints, esp. after sitting for a time. She notes this does improve once she moves around more.  Skin:  Negative for itching and rash.  Psychiatric/Behavioral:  Positive for memory loss. Negative for depression. The patient is not nervous/anxious.        Shelly Campbell has had issues with memory loss. She does live independently, but her daughter drives her places and assists with things like her grocery list and navigating int he grocery store. They are both concerned about whether she may have progressive dementia and making decisions about care.   Past Medical History: Patient Active Problem List   Diagnosis  Date Noted   Tick bite of left thigh 05/19/2023   Premature atrial contractions 05/07/2021   Stress incontinence with nocturia 05/06/2021   History of basal cell cancer 07/20/2017   Mild cognitive impairment 07/17/2016   Chronic kidney disease (CKD), stage II (mild) 08/16/2015   Chronic constipation 08/26/2014   Osteoporosis 03/14/2009   Obsessive-compulsive disorder 04/06/2007   Migraine headache without aura 04/06/2007   Past Surgical History:  Procedure Laterality Date   BREAST ENHANCEMENT SURGERY     bilat, 1973   Family History  Problem Relation Age of Onset   Colon polyps Mother    Heart attack Mother    Breast cancer Mother    ALS Father    Heart disease Father    Hypertension Brother    Heart disease Brother    Outpatient Medications Prior to Visit  Medication Sig Dispense Refill   calcium-vitamin D (OSCAL 500/200 D-3) 500-200 MG-UNIT tablet Take 1 tablet by mouth 2 (two) times daily. 180 tablet 3   Cyanocobalamin (VITAMIN B-12) 1000 MCG/15ML LIQD Take 15 mLs (1,000 mcg total) by mouth daily. 450 mL 5   glucosamine-chondroitin 500-400 MG tablet Take 1 tablet by mouth daily.     phenol (CHLORASEPTIC) 1.4 % LIQD Use as directed 1 spray in the mouth or throat as needed for throat irritation / pain. 118 mL 0   fluvoxaMINE (LUVOX) 100 MG tablet TAKE 1 TABLET(100 MG) BY MOUTH TWICE DAILY 180 tablet 3   SUMAtriptan (IMITREX) 100 MG tablet TAKE 1 TABLET(100 MG) BY MOUTH AT ONSET  OF MIGRAINE. MAY REPEAT IN 2 HOURS IF HEADACHE PERSISTS OR RECURS, NOT MORE THAN 2 DOSES/24 HRS 12 tablet 3   polyethylene glycol powder (GLYCOLAX/MIRALAX) 17 GM/SCOOP powder DISSOLVE 17 GRAMS IN 8 OUNCES OF WATER AND DRINK BY MOUTH ONCE DAILY (Patient not taking: Reported on 04/12/2023) 510 g 1   doxycycline (VIBRA-TABS) 100 MG tablet Take 1 tablet (100 mg total) by mouth 2 (two) times daily. 20 tablet 0   guaiFENesin (ROBITUSSIN) 100 MG/5ML liquid Take 5-10 mLs (100-200 mg total) by mouth every 4 (four)  hours as needed for cough or to loosen phlegm. 60 mL 0   No facility-administered medications prior to visit.   No Known Allergies Objective:   Today's Vitals   12/06/23 1054  BP: 124/78  Pulse: 69  Temp: (!) 97.3 F (36.3 C)  TempSrc: Temporal  SpO2: 97%  Weight: 143 lb 9.6 oz (65.1 kg)  Height: 5\' 5"  (1.651 m)   Body mass index is 23.9 kg/m.   General: Well developed, well nourished. No acute distress. HEENT: Normocephalic, non-traumatic. PERRL, EOMI. Conjunctiva clear. External ears normal. EAC and TMs normal   bilaterally. Nose clear without congestion or rhinorrhea. Mucous membranes moist. Oropharynx clear. Good dentition. Neck: Supple. No lymphadenopathy. No thyromegaly. Lungs: Clear to auscultation bilaterally. No wheezing, rales or rhonchi. CV: RRR without murmurs or rubs. Pulses 2+ bilaterally. Abdomen: Soft, non-tender. Bowel sounds positive, normal pitch and frequency. No hepatosplenomegaly. No rebound or   guarding. Extremities: Full ROM. No joint swelling or tenderness. No edema noted. Skin: Warm and dry. No rashes. Psych: Alert and oriented. Normal mood and affect.  Health Maintenance Due  Topic Date Due   Zoster Vaccines- Shingrix (1 of 2) Never done   DTaP/Tdap/Td (2 - Td or Tdap) 08/12/2023     Assessment & Plan:   Problem List Items Addressed This Visit       Cardiovascular and Mediastinum   Migraine headache without aura    Stable. Continue Imitrex as needed.      Relevant Medications   fluvoxaMINE (LUVOX) 100 MG tablet   SUMAtriptan (IMITREX) 100 MG tablet     Other   Mild cognitive impairment    Previously identified with [facility]. The description of her memory issues still sounds like this is the case. However, for family reassurance, I will refer her to neurology for cognitive testing and to consider if she might benefit from Aricept.      Relevant Orders   Ambulatory referral to Neurology   Obsessive-compulsive disorder    Stable.  Continue fluvoxamine 100 mg bid.      Relevant Medications   fluvoxaMINE (LUVOX) 100 MG tablet   Other Visit Diagnoses     Annual physical exam    -  Primary   Overall fair health. Recommend she ask her pharmacist about a Td. UTD on other screenings.   Hearing difficulty of both ears       Relevant Orders   Ambulatory referral to Audiology       Return in about 6 months (around 06/05/2024) for Reassessment.   Loyola Mast, MD

## 2023-12-06 NOTE — Assessment & Plan Note (Signed)
Previously identified with [facility]. The description of her memory issues still sounds like this is the case. However, for family reassurance, I will refer her to neurology for cognitive testing and to consider if she might benefit from Aricept.

## 2023-12-06 NOTE — Assessment & Plan Note (Signed)
Stable.  Continue Imitrex as needed. 

## 2023-12-06 NOTE — Assessment & Plan Note (Signed)
Stable. Continue fluvoxamine 100 mg bid.

## 2024-01-10 ENCOUNTER — Ambulatory Visit: Payer: No Typology Code available for payment source | Attending: Family Medicine | Admitting: Audiologist

## 2024-01-10 DIAGNOSIS — H903 Sensorineural hearing loss, bilateral: Secondary | ICD-10-CM | POA: Insufficient documentation

## 2024-01-10 NOTE — Procedures (Signed)
  Outpatient Audiology and [facility] [address] [place], [place]  [address] [phone]  AUDIOLOGICAL  EVALUATION  NAME: Shelly Campbell     DOB:   Jul 03, 1941      MRN: [id number]                                                                                     DATE: 01/10/2024     REFERENT: Thedora Garnette HERO, MD STATUS: Outpatient DIAGNOSIS: Sensorineural Hearing Loss Bilateral    History: [name] was seen for an audiological evaluation due to difficulty hearing at church and people sounding muffled. She feels she hears people well, but she cannot make out what they are saying.  Avalene denies pain or pressure in either ear. She has occasional cricket sounding tinnitus.  [name] no history of hazardous noise exposure.  Medical history shows no risk for hearing loss.    Evaluation:  Otoscopy showed a clear view of the tympanic membranes, bilaterally Tympanometry results were consistent with normal middle ear function, ibuprofen  Audiometric testing was completed using Conventional Audiometry techniques with insert earphones and supraural headphones. Test results are consistent with normal sloping to moderately severe sensorineural hearing loss bilaterally. Speech Recognition Thresholds were obtained at 25 dB HL in the right ear and at 25  dB HL in the left ear. Word Recognition Testing was completed at  40dB SL and [name] scored 100% in the right ear and 92% in the left ear.   Results:  The test results were reviewed with [name] and her daughter. Three copies of the audiogram given to patient and daughter. [name] has a sloping sensorineural hearing loss bilaterally. This type of loss happens with age related change. [name] needs hearing aids for both ears. All questions answered. Nature and degree of loss explained.  Audiogram printed and provided to [name].    Recommendations: Hearing aids recommended for both ears. Patient given list of local hearing aid  providers.  Annual audiometric testing recommended to monitor hearing loss for progression.   34 minutes spent testing and counseling on results.   If you have any questions please feel free to contact me at [phone].  Lauraine Ka Au.D.  Audiologist   01/10/2024  1:44 PM  Cc: Thedora Garnette HERO, MD

## 2024-02-21 DIAGNOSIS — H903 Sensorineural hearing loss, bilateral: Secondary | ICD-10-CM | POA: Diagnosis not present

## 2024-03-20 DIAGNOSIS — H903 Sensorineural hearing loss, bilateral: Secondary | ICD-10-CM | POA: Diagnosis not present

## 2024-04-03 ENCOUNTER — Ambulatory Visit: Payer: No Typology Code available for payment source | Admitting: Neurology

## 2024-04-04 ENCOUNTER — Ambulatory Visit (INDEPENDENT_AMBULATORY_CARE_PROVIDER_SITE_OTHER): Admitting: Neurology

## 2024-04-04 ENCOUNTER — Encounter: Payer: Self-pay | Admitting: Neurology

## 2024-04-04 VITALS — BP 137/83 | HR 64 | Ht 65.5 in | Wt 142.8 lb

## 2024-04-04 DIAGNOSIS — Z820 Family history of epilepsy and other diseases of the nervous system: Secondary | ICD-10-CM

## 2024-04-04 DIAGNOSIS — R6889 Other general symptoms and signs: Secondary | ICD-10-CM

## 2024-04-04 DIAGNOSIS — R413 Other amnesia: Secondary | ICD-10-CM

## 2024-04-04 DIAGNOSIS — F419 Anxiety disorder, unspecified: Secondary | ICD-10-CM
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
## 2024-04-04 NOTE — Patient Instructions (Signed)
 It was nice to meet you today.  You have complaints of memory loss: memory loss or changes in cognitive function can have many reasons and does not always mean you have dementia.  There are several conditions and situations that can contribute to subjective or objective memory loss.  These factors include: depression, stress, sleep deprivation or poor sleep from insomnia or sleep apnea, dehydration, fluctuation in blood sugar values, thyroid or electrolyte dysfunction, medication effects from sedating medications or narcotic pain medication for example and certain vitamin deficiencies such as vitamin B12 deficiency, and anemia. Dementia can be caused by stroke, brain atherosclerosis or brain vascular disease due to vascular risk factors (smoking, high blood pressure, high cholesterol, obesity and uncontrolled diabetes), certain degenerative brain disorders (including Parkinson's disease and Multiple sclerosis) and by Alzheimer's disease or other, more rare and sometimes hereditary causes.   Here is what I would recommend:   blood work (which we will do today).  We will do a brain scan, called MRI and call you with the test results. We will have to schedule you for this on a separate date. This test requires authorization from your insurance, and we will take care of the insurance process. We may consider medication for memory loss in the near future.   We will keep you posted as to your test results by phone call for now.  We will plan a follow-up after testing.

## 2024-04-05 LAB — COMPREHENSIVE METABOLIC PANEL WITH GFR
ALT: 16 IU/L (ref 0–32)
AST: 24 IU/L (ref 0–40)
Albumin: 4.2 g/dL (ref 3.7–4.7)
Alkaline Phosphatase: 83 IU/L (ref 44–121)
BUN/Creatinine Ratio: 17 (ref 12–28)
BUN: 13 mg/dL (ref 8–27)
Bilirubin Total: 0.3 mg/dL (ref 0.0–1.2)
CO2: 26 mmol/L (ref 20–29)
Calcium: 10 mg/dL (ref 8.7–10.3)
Chloride: 103 mmol/L (ref 96–106)
Creatinine, Ser: 0.76 mg/dL (ref 0.57–1.00)
Globulin, Total: 2.4 g/dL (ref 1.5–4.5)
Glucose: 103 mg/dL — ABNORMAL HIGH (ref 70–99)
Potassium: 5.8 mmol/L — ABNORMAL HIGH (ref 3.5–5.2)
Sodium: 142 mmol/L (ref 134–144)
Total Protein: 6.6 g/dL (ref 6.0–8.5)
eGFR: 77 mL/min/{1.73_m2} (ref >59)

## 2024-04-05 LAB — TSH: TSH: 2.34 u[IU]/mL (ref 0.450–4.500)

## 2024-04-05 LAB — HGB A1C W/O EAG: Hgb A1c MFr Bld: 5.6 % (ref 4.8–5.6)

## 2024-04-05 LAB — B12 AND FOLATE PANEL
Folate: 10.6 ng/mL (ref >3.0)
Vitamin B-12: 1604 pg/mL — ABNORMAL HIGH (ref 232–1245)

## 2024-04-05 LAB — VITAMIN D 25 HYDROXY (VIT D DEFICIENCY, FRACTURES): Vit D, 25-Hydroxy: 39.6 ng/mL (ref 30.0–100.0)

## 2024-04-05 LAB — RPR: RPR Ser Ql: NONREACTIVE

## 2024-04-06 ENCOUNTER — Telehealth: Payer: Self-pay | Admitting: *Deleted

## 2024-04-06 NOTE — Telephone Encounter (Signed)
 Called patient's daughter Inetta Fermo and LVM (ok per DPR) with lab results and recommendations as noted below by Dr. Frances Furbish.  I did ask for her to call us back to confirm receipt of this message.  I also let her know that the results were being sent over to primary care to recheck the potassium at the April 21 visit. Left office number in message.

## 2024-04-06 NOTE — Telephone Encounter (Signed)
 Noted thanks

## 2024-04-06 NOTE — Telephone Encounter (Signed)
-----   Message from Huston Foley sent at 04/05/2024  8:38 AM EDT ----- Please advise patient or her daughter on DPR that patient's potassium level was elevated at 5.8.  It has been high before, sometimes it is a falsely high value.  I would recommend she get it rechecked by her PCP at the next appointment which is coming up soon.  Vitamin B12 is elevated, I would recommend that she take a break from taking vitamin B12 supplementation.  She can continue with her vitamin D supplementation, her vitamin D level was on the low normal side, all other labs were fine.

## 2024-04-06 NOTE — Telephone Encounter (Signed)
 Daughter Inetta Fermo called back to let RN know that the message was received. Daughter verbalized appreciation.

## 2024-04-13 ENCOUNTER — Telehealth: Payer: Self-pay | Admitting: Neurology

## 2024-04-13 NOTE — Telephone Encounter (Signed)
 [employer] auth: [id number] exp. 04/13/24-06/13/24 sent to GI [phone]

## 2024-04-14 ENCOUNTER — Encounter: Payer: Self-pay | Admitting: Neurology

## 2024-04-17 ENCOUNTER — Encounter: Payer: Self-pay | Admitting: Family Medicine

## 2024-04-17 ENCOUNTER — Ambulatory Visit (INDEPENDENT_AMBULATORY_CARE_PROVIDER_SITE_OTHER): Admitting: Family Medicine

## 2024-04-17 VITALS — BP 120/70 | HR 94 | Temp 97.9°F | Ht 65.5 in | Wt 139.8 lb

## 2024-04-17 DIAGNOSIS — F429 Obsessive-compulsive disorder, unspecified: Secondary | ICD-10-CM

## 2024-04-17 DIAGNOSIS — G3184 Mild cognitive impairment, so stated: Secondary | ICD-10-CM | POA: Diagnosis not present

## 2024-04-17 DIAGNOSIS — E875 Hyperkalemia: Secondary | ICD-10-CM | POA: Diagnosis not present

## 2024-04-17 LAB — BASIC METABOLIC PANEL WITH GFR
BUN: 21 mg/dL (ref 6–23)
CO2: 31 meq/L (ref 19–32)
Calcium: 9.2 mg/dL (ref 8.4–10.5)
Chloride: 104 meq/L (ref 96–112)
Creatinine, Ser: 0.8 mg/dL (ref 0.40–1.20)
GFR: 67.62 mL/min (ref >60.00)
Glucose, Bld: 116 mg/dL — ABNORMAL HIGH (ref 70–99)
Potassium: 4.1 meq/L (ref 3.5–5.1)
Sodium: 142 meq/L (ref 135–145)

## 2024-04-17 MED ORDER — FLUVOXAMINE MALEATE 100 MG PO TABS: ORAL_TABLET | ORAL | 3 refills | Status: AC

## 2024-04-17 NOTE — Assessment & Plan Note (Signed)
 I will try adding a 50 mg dose of fluvoxamine  int he morning and see if this improves some of her increased anxiety.

## 2024-04-17 NOTE — Assessment & Plan Note (Signed)
 Mild to moderate memory loss consistent with MCI. Agree with recent move to live with her daughter. Discussed her decision to not have an MRI scan. I do not think this is unreasonable.

## 2024-04-17 NOTE — Progress Notes (Signed)
 [facility] [address] [place] [address] Dept: [phone] Dept Fax: [phone]  Office Visit  Subjective:    Patient ID: Shelly Campbell, female    DOB: [birth year], 83 y.o..   MRN: [id number]  Chief Complaint  Patient presents with   Follow-up    Discuss Neurology appointment/meds.      History of Present Illness:  Patient is in today for follow-up of several issues that came to light as part of her recent neurology assessment. She had been referred to Dr. Omar Bibber related to an evaluation of memory issues. Since her last visit, she has moved in with her daughter. They both feel good about this situation. Shelly Campbell had been having some increased anxiety. She ha a history of OCD. She is managed on fluvoxamine  100 mg bid (though she takes it as 200 mg at night). Dr. Omar Bibber had recommended an MRI scan of the brain, but Shelly Campbell dose not want to have this procedure done.  Past Medical History: Patient Active Problem List   Diagnosis Date Noted   Tick bite of left thigh 05/19/2023   Premature atrial contractions 05/07/2021   Stress incontinence with nocturia 05/06/2021   History of basal cell cancer 07/20/2017   Mild cognitive impairment 07/17/2016   Chronic kidney disease (CKD), stage II (mild) 08/16/2015   Chronic constipation 08/26/2014   Osteoporosis 03/14/2009   Obsessive-compulsive disorder 04/06/2007   Migraine headache without aura 04/06/2007   Past Surgical History:  Procedure Laterality Date   BREAST ENHANCEMENT SURGERY     bilat, 1973   Family History  Problem Relation Age of Onset   Colon polyps Mother    Heart attack Mother    Breast cancer Mother    ALS Father    Heart disease Father    Hypertension Brother    Heart disease Brother    Outpatient Medications Prior to Visit  Medication Sig Dispense Refill   calcium -vitamin D  (OSCAL 500/200 D-3) 500-200 MG-UNIT tablet Take 1 tablet by mouth  2 (two) times daily. 180 tablet 3   cyanocobalamin  (VITAMIN B12) 500 MCG tablet Take 500 mcg by mouth daily.     SUMAtriptan  (IMITREX ) 100 MG tablet TAKE 1 TABLET(100 MG) BY MOUTH AT ONSET OF MIGRAINE. MAY REPEAT IN 2 HOURS IF HEADACHE PERSISTS OR RECURS, NOT MORE THAN 2 DOSES/24 HRS 12 tablet 3   fluvoxaMINE  (LUVOX ) 100 MG tablet TAKE 1 TABLET(100 MG) BY MOUTH TWICE DAILY 180 tablet 3   No facility-administered medications prior to visit.   No Known Allergies   Objective:   Today's Vitals   04/17/24 1052  BP: 120/70  Pulse: 94  Temp: 97.9 F (36.6 C)  TempSrc: Temporal  SpO2: 97%  Weight: 139 lb 12.8 oz (63.4 kg)  Height: 5' 5.5" (1.664 m)   Body mass index is 22.91 kg/m.   General: Well developed, well nourished. No acute distress. Psych: Alert and oriented. Normal mood and affect.  Health Maintenance Due  Topic Date Due   Zoster Vaccines- Shingrix (1 of 2) Never done   DTaP/Tdap/Td (2 - Td or Tdap) 08/12/2023   Medicare Annual Wellness (AWV)  04/11/2024   Lab Results    Latest Ref Rng & Units 04/04/2024    2:40 PM 05/07/2021   11:12 AM 05/06/2021    3:55 PM  CMP  Glucose 70 - 99 mg/dL 324  401  97   BUN 8 - 27 mg/dL 13  13  15  Creatinine 0.57 - 1.00 mg/dL 8.29  5.62  1.30   Sodium 134 - 144 mmol/L 142  139  140   Potassium 3.5 - 5.2 mmol/L 5.8  4.9  6.0   Chloride 96 - 106 mmol/L 103  105  103   CO2 20 - 29 mmol/L 26  30  26    Calcium  8.7 - 10.3 mg/dL 86.5  9.4  9.7   Total Protein 6.0 - 8.5 g/dL 6.6   6.6   Total Bilirubin 0.0 - 1.2 mg/dL 0.3   <7.8   Alkaline Phos 44 - 121 IU/L 83   84   AST 0 - 40 IU/L 24   21   ALT 0 - 32 IU/L 16   14    Lab Results  Component Value Date   HGBA1C 5.6 04/04/2024   Last thyroid  functions Lab Results  Component Value Date   TSH 2.340 04/04/2024   Last vitamin D  Lab Results  Component Value Date   VD25OH 39.6 04/04/2024   Last vitamin B12 and Folate Lab Results  Component Value Date   VITAMINB12 1,604 (H)  04/04/2024   FOLATE 10.6 04/04/2024   Component Ref Range & Units (hover) 13 d ago  RPR Ser Ql Non Reactive      Assessment & Plan:   Problem List Items Addressed This Visit       Other   Mild cognitive impairment   Mild to moderate memory loss consistent with MCI. Agree with recent move to live with her daughter. Discussed her decision to not have an MRI scan. I do not think this is unreasonable.      Obsessive-compulsive disorder - Primary   I will try adding a 50 mg dose of fluvoxamine  int he morning and see if this improves some of her increased anxiety.      Relevant Medications   fluvoxaMINE  (LUVOX ) 100 MG tablet   Other Visit Diagnoses       Hyperkalemia       Recent elevated potassium. I will reassess today and have asked that staff use good phlebotomy technique to reduce risk for pseudohyperkalemia.   Relevant Orders   Basic metabolic panel with GFR       Return in about 6 weeks (around 05/29/2024) for Reassessment.   Graig Lawyer, MD

## 2024-05-01 ENCOUNTER — Telehealth: Payer: Self-pay | Admitting: Neurology

## 2024-05-01 NOTE — Telephone Encounter (Signed)
 Noted.

## 2024-05-01 NOTE — Telephone Encounter (Signed)
 Patient's daughter notified patient has decided not to get MRI done,

## 2024-05-01 NOTE — Telephone Encounter (Signed)
 Ok,noted

## 2024-05-02 NOTE — Telephone Encounter (Signed)
 Patient's daughter notified patient has decided not to get MRI done

## 2024-05-05 DIAGNOSIS — L570 Actinic keratosis: Secondary | ICD-10-CM | POA: Diagnosis not present

## 2024-05-05 DIAGNOSIS — D485 Neoplasm of uncertain behavior of skin: Secondary | ICD-10-CM | POA: Diagnosis not present

## 2024-05-05 DIAGNOSIS — L814 Other melanin hyperpigmentation: Secondary | ICD-10-CM | POA: Diagnosis not present

## 2024-05-05 DIAGNOSIS — L821 Other seborrheic keratosis: Secondary | ICD-10-CM | POA: Diagnosis not present

## 2024-05-05 DIAGNOSIS — D1801 Hemangioma of skin and subcutaneous tissue: Secondary | ICD-10-CM | POA: Diagnosis not present

## 2024-05-05 DIAGNOSIS — B078 Other viral warts: Secondary | ICD-10-CM | POA: Diagnosis not present

## 2024-05-05 DIAGNOSIS — Z85828 Personal history of other malignant neoplasm of skin: Secondary | ICD-10-CM | POA: Diagnosis not present

## 2024-05-29 ENCOUNTER — Ambulatory Visit (INDEPENDENT_AMBULATORY_CARE_PROVIDER_SITE_OTHER): Admitting: Family Medicine

## 2024-05-29 ENCOUNTER — Encounter: Payer: Self-pay | Admitting: Family Medicine

## 2024-05-29 ENCOUNTER — Telehealth: Payer: Self-pay | Admitting: Neurology

## 2024-05-29 VITALS — BP 124/76 | HR 67 | Temp 97.6°F | Ht 65.5 in | Wt 141.2 lb

## 2024-05-29 DIAGNOSIS — R5383 Other fatigue: Secondary | ICD-10-CM | POA: Diagnosis not present

## 2024-05-29 DIAGNOSIS — F429 Obsessive-compulsive disorder, unspecified: Secondary | ICD-10-CM

## 2024-05-29 NOTE — Assessment & Plan Note (Signed)
 Improved. It is possible the morning fluvoxamine  dose is contributing to lethargy, but it did not appear to occur soon after this was added. I will continue fluvoxamine  50 mg q am and 200 mg at bedtime.

## 2024-05-29 NOTE — Progress Notes (Signed)
 [facility] [address] [place] [address] Dept: [phone] Dept Fax: [phone]  Chronic Care Office Visit  Subjective:    Patient ID: Shelly Campbell, female    DOB: May 08, 1941, 83 y.o..   MRN: [id number]  Chief Complaint  Patient presents with   Follow-up    6 week f/u.  Hasn't been taking B12.  Feeling draggy!   History of Present Illness:  Patient is in today for reassessment of chronic medical issues.  At her last visit, Ms. Colon had been having some increased anxiety. She has a history of OCD. She had been managed on fluvoxamine  200 mg at bedtime. I added a 50 mg morning dose to see if this would help. Her anxiety is apparently doing better. in the past week, she has been experiencing more lethargy/fatigue. She had a day where she felt she did not have the energy to get out of her chair. She has had anemia at times in her life and she wonders about this.  Past Medical History: Patient Active Problem List   Diagnosis Date Noted   Tick bite of left thigh 05/19/2023   Premature atrial contractions 05/07/2021   Stress incontinence with nocturia 05/06/2021   History of basal cell cancer 07/20/2017   Mild cognitive impairment 07/17/2016   Chronic kidney disease (CKD), stage II (mild) 08/16/2015   Chronic constipation 08/26/2014   Osteoporosis 03/14/2009   Obsessive-compulsive disorder 04/06/2007   Migraine headache without aura 04/06/2007   Past Surgical History:  Procedure Laterality Date   BREAST ENHANCEMENT SURGERY     bilat, 1973   Family History  Problem Relation Age of Onset   Colon polyps Mother    Heart attack Mother    Breast cancer Mother    ALS Father    Heart disease Father    Hypertension Brother    Heart disease Brother    Outpatient Medications Prior to Visit  Medication Sig Dispense Refill   calcium -vitamin D  (OSCAL 500/200 D-3) 500-200 MG-UNIT tablet Take 1 tablet by mouth 2  (two) times daily. 180 tablet 3   fluvoxaMINE  (LUVOX ) 100 MG tablet Take 0.5 tablets (50 mg total) by mouth every morning AND 2 tablets (200 mg total) at bedtime. 225 tablet 3   SUMAtriptan  (IMITREX ) 100 MG tablet TAKE 1 TABLET(100 MG) BY MOUTH AT ONSET OF MIGRAINE. MAY REPEAT IN 2 HOURS IF HEADACHE PERSISTS OR RECURS, NOT MORE THAN 2 DOSES/24 HRS 12 tablet 3   cyanocobalamin  (VITAMIN B12) 500 MCG tablet Take 500 mcg by mouth daily. (Patient not taking: Reported on 05/29/2024)     No facility-administered medications prior to visit.   No Known Allergies Objective:   Today's Vitals   05/29/24 1505  BP: 124/76  Pulse: 67  Temp: 97.6 F (36.4 C)  TempSrc: Temporal  SpO2: 98%  Weight: 141 lb 3.2 oz (64 kg)  Height: 5' 5.5" (1.664 m)   Body mass index is 23.14 kg/m.   General: Well developed, well nourished. No acute distress. Psych: Alert and oriented. Normal mood and affect. Appears bright and happy.  Health Maintenance Due  Topic Date Due   Zoster Vaccines- Shingrix (1 of 2) Never done   DTaP/Tdap/Td (2 - Td or Tdap) 08/12/2023   Medicare Annual Wellness (AWV)  04/11/2024   Lab Results    Latest Ref Rng & Units 04/17/2024   11:51 AM 04/04/2024    2:40 PM 05/07/2021   11:12 AM  CMP  Glucose 70 -  99 mg/dL 161  096  045   BUN 6 - 23 mg/dL 21  13  13    Creatinine 0.40 - 1.20 mg/dL 4.09  8.11  9.14   Sodium 135 - 145 mEq/L 142  142  139   Potassium 3.5 - 5.1 mEq/L 4.1  5.8  4.9   Chloride 96 - 112 mEq/L 104  103  105   CO2 19 - 32 mEq/L 31  26  30    Calcium  8.4 - 10.5 mg/dL 9.2  78.2  9.4   Total Protein 6.0 - 8.5 g/dL  6.6    Total Bilirubin 0.0 - 1.2 mg/dL  0.3    Alkaline Phos 44 - 121 IU/L  83    AST 0 - 40 IU/L  24    ALT 0 - 32 IU/L  16     Last thyroid  functions Lab Results  Component Value Date   TSH 2.340 04/04/2024   Last vitamin B12 and Folate Lab Results  Component Value Date   VITAMINB12 1,604 (H) 04/04/2024   FOLATE 10.6 04/04/2024      Assessment &  Plan:   Problem List Items Addressed This Visit       Other   Obsessive-compulsive disorder - Primary   Improved. It is possible the morning fluvoxamine  dose is contributing to lethargy, but it did not appear to occur soon after this was added. I will continue fluvoxamine  50 mg q am and 200 mg at bedtime.        Other Visit Diagnoses       Other fatigue       This appears to have been transient. I will check a CBC and TSH today to assess for underlying causes.   Relevant Orders   CBC   TSH       Return in about 3 months (around 08/29/2024) for Reassessment.   Graig Lawyer, MD

## 2024-05-29 NOTE — Telephone Encounter (Signed)
 noted

## 2024-05-29 NOTE — Telephone Encounter (Signed)
 FYI-Pt's daughter called to inform the provider that the pt will not be doing the brain scan that she was referred to do.

## 2024-05-30 ENCOUNTER — Ambulatory Visit: Payer: Self-pay | Admitting: Family Medicine

## 2024-05-30 LAB — CBC
HCT: 39.3 % (ref 36.0–46.0)
Hemoglobin: 13.2 g/dL (ref 12.0–15.0)
MCHC: 33.5 g/dL (ref 30.0–36.0)
MCV: 94.8 fl (ref 78.0–100.0)
Platelets: 187 10*3/uL (ref 150.0–400.0)
RBC: 4.15 Mil/uL (ref 3.87–5.11)
RDW: 13.2 % (ref 11.5–15.5)
WBC: 5.5 10*3/uL (ref 4.0–10.5)

## 2024-05-30 LAB — TSH: TSH: 1.53 u[IU]/mL (ref 0.35–5.50)

## 2024-05-30 NOTE — Telephone Encounter (Signed)
 Noted, thank you

## 2024-06-02 ENCOUNTER — Telehealth: Payer: Self-pay

## 2024-06-02 NOTE — Telephone Encounter (Signed)
 Patient informed of results  Copied from CRM [id number]. Topic: Clinical - Lab/Test Results >> Jun 02, 2024 12:33 PM Allyne Areola wrote: Reason for CRM: Patient is returning a call she received from the office about lab results. Advised of Dr.Rudd's note. Patient understood and no questions.

## 2024-06-05 ENCOUNTER — Ambulatory Visit: Payer: No Typology Code available for payment source | Admitting: Family Medicine

## 2024-06-26 ENCOUNTER — Ambulatory Visit: Admitting: Neurology

## 2024-07-21 ENCOUNTER — Encounter

## 2024-09-01 ENCOUNTER — Ambulatory Visit: Admitting: Family Medicine

## 2025-01-05 ENCOUNTER — Ambulatory Visit

## 2025-01-31 ENCOUNTER — Encounter: Payer: Self-pay | Admitting: Family Medicine

## 2025-01-31 DIAGNOSIS — D099 Carcinoma in situ, unspecified: Secondary | ICD-10-CM | POA: Insufficient documentation

## 2025-02-26 ENCOUNTER — Ambulatory Visit
